# Patient Record
Sex: Male | Born: 1975 | Race: White | Hispanic: No | Marital: Married | Smoking: Never smoker
Health system: Southern US, Community
[De-identification: ages and names within clinical notes are randomized; demographics above are authoritative.]

## PROBLEM LIST (undated history)

## (undated) DIAGNOSIS — G4730 Sleep apnea, unspecified: Secondary | ICD-10-CM

## (undated) DIAGNOSIS — I10 Essential (primary) hypertension: Secondary | ICD-10-CM

## (undated) DIAGNOSIS — E119 Type 2 diabetes mellitus without complications: Secondary | ICD-10-CM

## (undated) HISTORY — DX: Sleep apnea, unspecified: G47.30

## (undated) HISTORY — PX: CYSTECTOMY: SUR359

## (undated) HISTORY — DX: Type 2 diabetes mellitus without complications: E11.9

## (undated) HISTORY — DX: Essential (primary) hypertension: I10

---

## 2003-05-31 HISTORY — PX: NASAL SEPTUM SURGERY: SHX37

## 2010-05-30 HISTORY — PX: OTHER SURGICAL HISTORY: SHX169

## 2011-02-14 ENCOUNTER — Ambulatory Visit: Payer: Self-pay | Admitting: Cardiothoracic Surgery

## 2011-02-17 ENCOUNTER — Ambulatory Visit: Payer: Self-pay | Admitting: Cardiothoracic Surgery

## 2011-02-28 ENCOUNTER — Ambulatory Visit: Payer: Self-pay | Admitting: Cardiothoracic Surgery

## 2011-03-31 ENCOUNTER — Ambulatory Visit: Payer: Self-pay | Admitting: Cardiothoracic Surgery

## 2012-03-25 IMAGING — CT CT CHEST W/ CM
1 series · 15 of 33 positions shown, 19 images · non-contrast
Comparison: none

REASON FOR EXAM: Abn Chest Xray  Diabetic Metformin
COMMENTS:

[Series 2: chest w/ 5.0 i41f 3 · axial · 0.94mm/px · z∈[-20,+256]mm · 15 of 65 slices shown, 19 images]
[im 5/65  mediastinal]
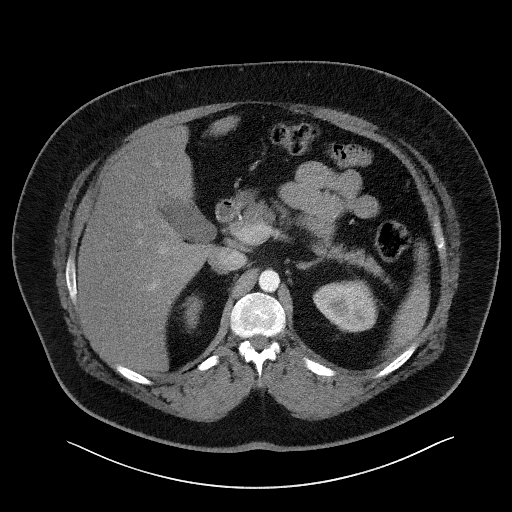
[im 5/65  lung]
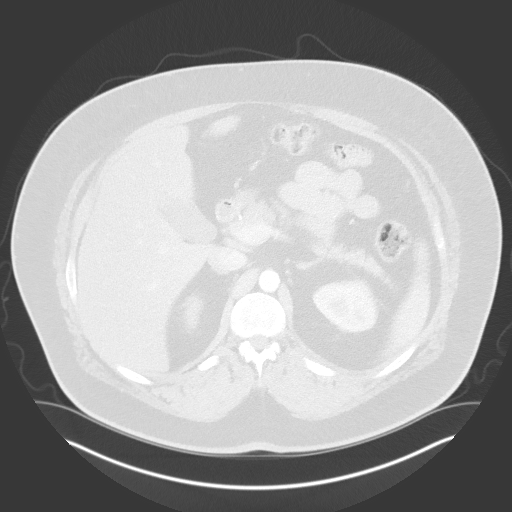
[im 10/65  lung]
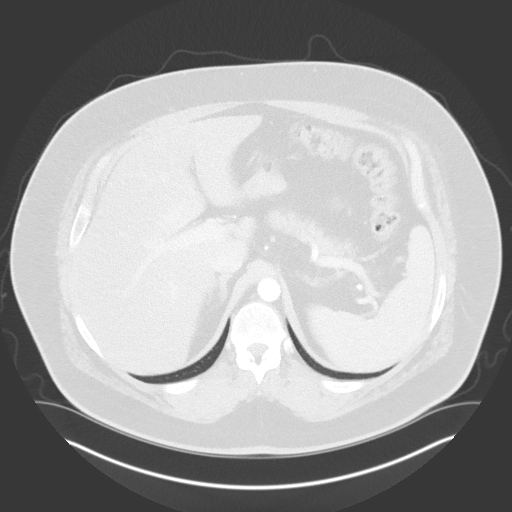
[im 13/65  lung]
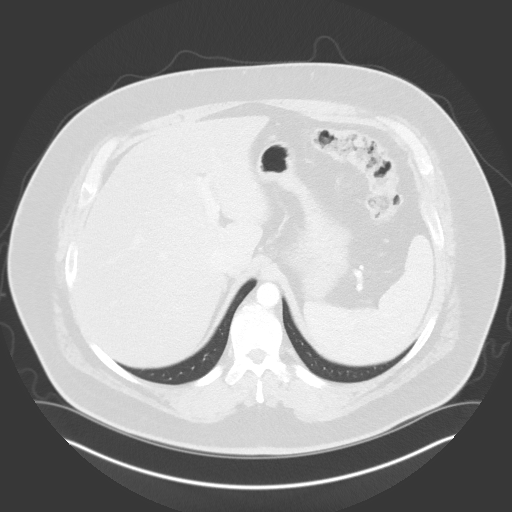
[im 17/65  lung]
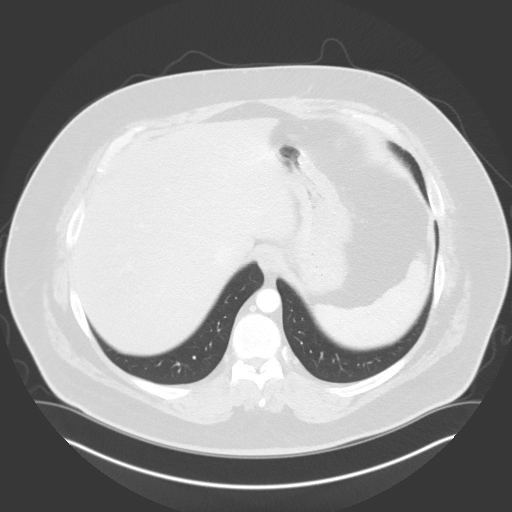
[im 22/65  mediastinal]
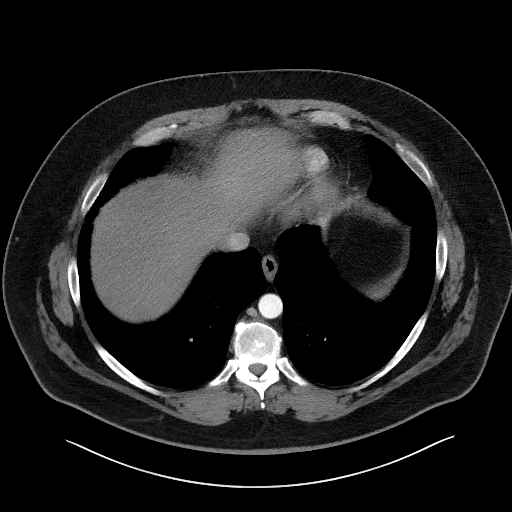
[im 22/65  lung]
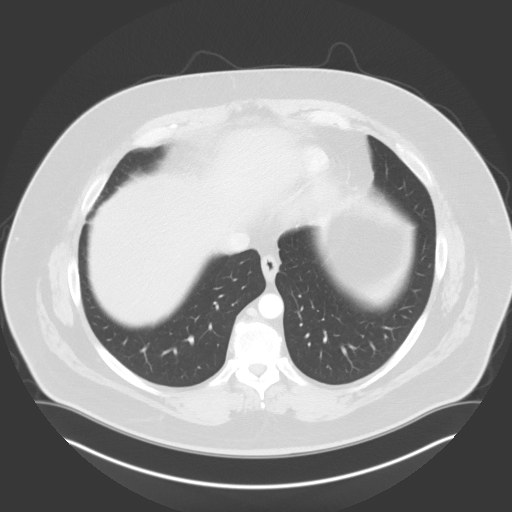
[im 26/65  lung]
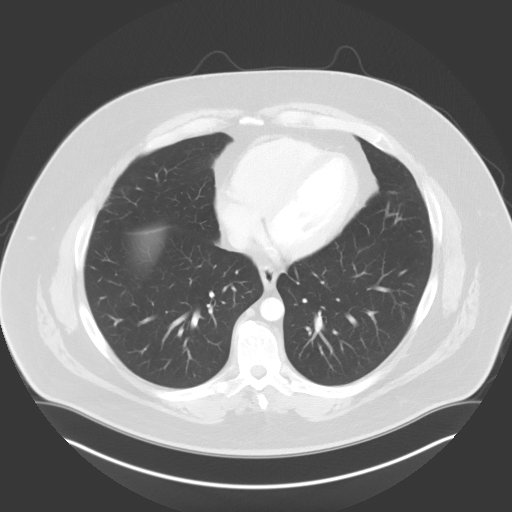
[im 29/65  lung]
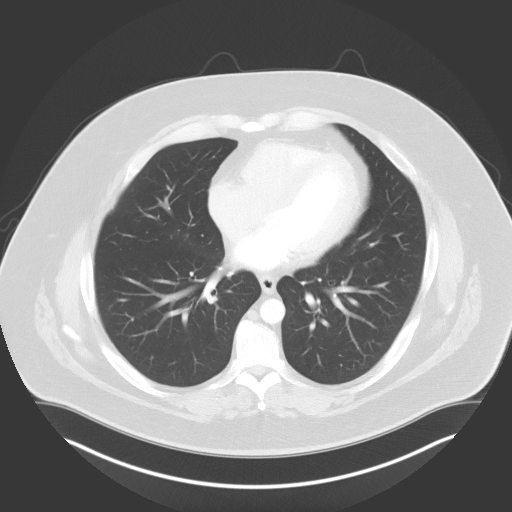
[im 34/65  lung]
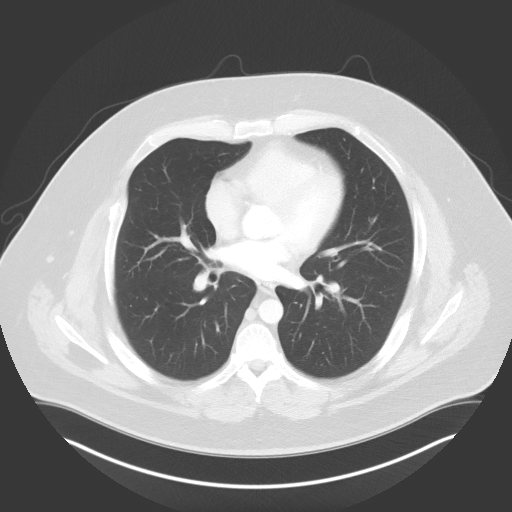
[im 36/65  mediastinal]
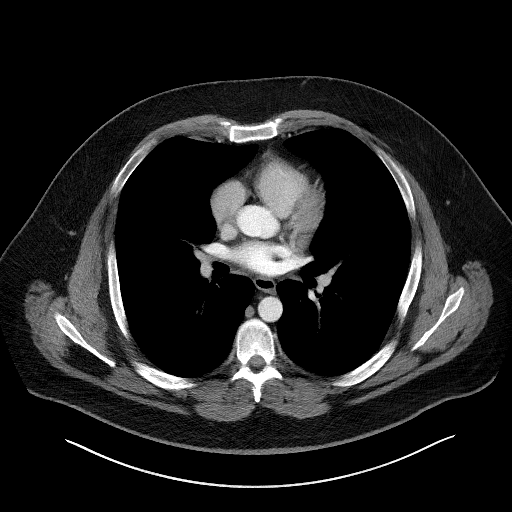
[im 36/65  lung]
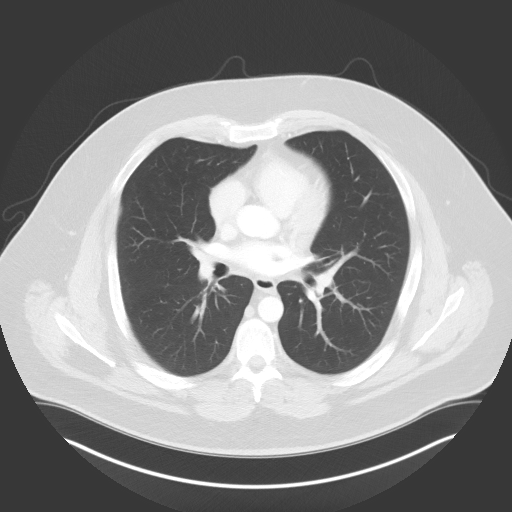
[im 39/65  lung]
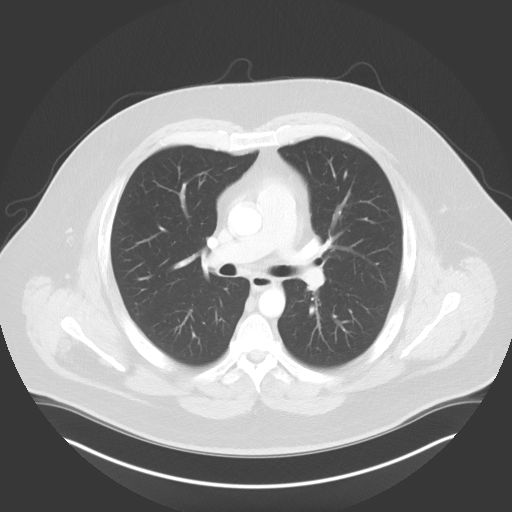
[im 43/65  lung]
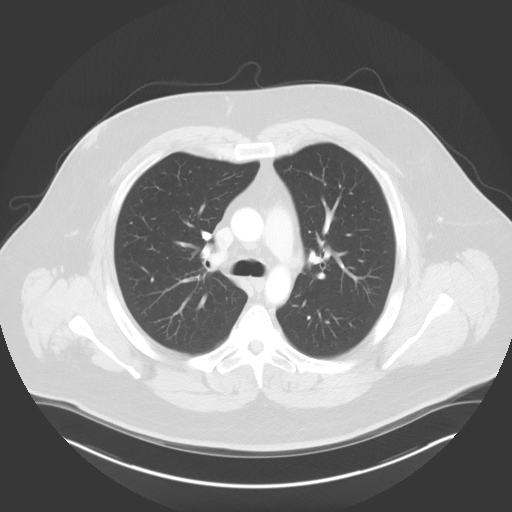
[im 48/65  lung]
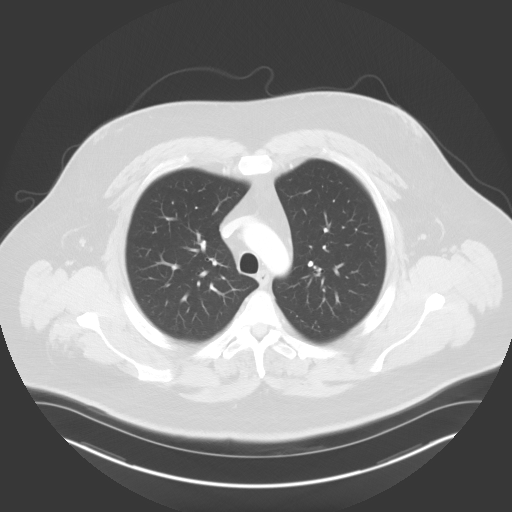
[im 52/65  mediastinal]
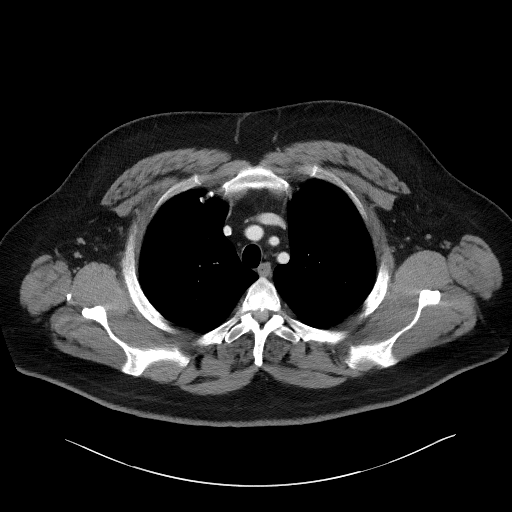
[im 52/65  lung]
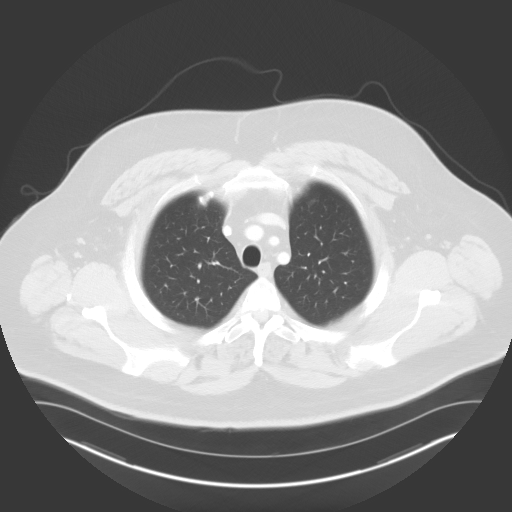
[im 55/65  lung]
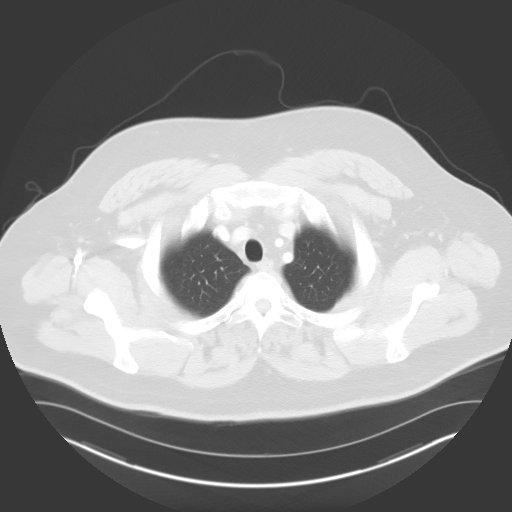
[im 60/65  lung]
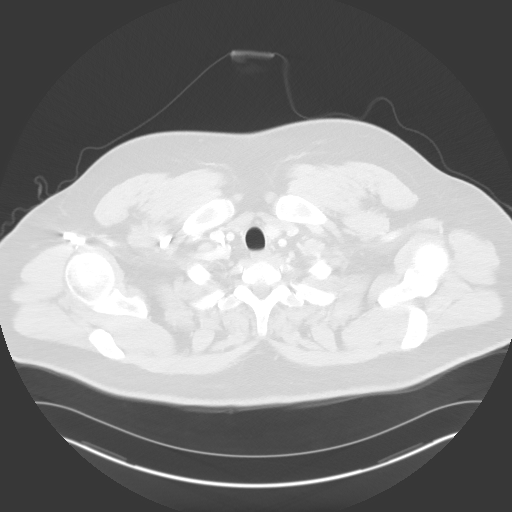

[15 of 33 positions shown; findings below may reference images not displayed]

PROCEDURE:     KCT - KCT CHEST WITH CONTRAST  - February 28, 2011  [DATE]

RESULT:     CT of the chest is performed utilizing 85 mL of 6sovue-92C
iodinated intravenous contrast. Images are reconstructed at 5.0 mm slice
thickness in the axial plane. There is no previous similar study for
comparison.

Lung window images demonstrate normal aeration without a focal mass. There
is deformity of the chest wall consistent with a previous rib fracture
involving the right sixth rib anterolaterally with evidence of callus
formation. No mediastinal or hilar mass or adenopathy is evident. The
thoracic aorta is normal in caliber. No significant pleural thickening is
demonstrated. The upper abdominal viscera included on the exam appear within
normal limits. The included portions of the thyroid lobes are unremarkable.
Thoracic spine vertebral body shows no evidence of compression deformity.
There is multilevel degenerative disc space narrowing and hypertrophic
degenerative spurring.
IMPRESSION: Old healed right rib fracture as discussed above. No
definite mass or adenopathy evident.

## 2013-03-11 ENCOUNTER — Encounter: Payer: Self-pay | Admitting: General Surgery

## 2013-03-11 ENCOUNTER — Ambulatory Visit (INDEPENDENT_AMBULATORY_CARE_PROVIDER_SITE_OTHER): Payer: BC Managed Care – PPO | Admitting: General Surgery

## 2013-03-11 VITALS — BP 128/70 | HR 76 | Resp 14 | Ht 75.0 in | Wt 331.0 lb

## 2013-03-11 DIAGNOSIS — L729 Follicular cyst of the skin and subcutaneous tissue, unspecified: Secondary | ICD-10-CM | POA: Insufficient documentation

## 2013-03-11 DIAGNOSIS — L723 Sebaceous cyst: Secondary | ICD-10-CM

## 2013-03-11 NOTE — Progress Notes (Signed)
Patient ID: Colton Long, male   DOB: 1976-01-15, 37 y.o.   MRN: [id number]  Chief Complaint  Patient presents with  . Other    evaluation of sebaceous cyst on scalp    HPI Colton Long is a 37 y.o. male who presents for an evaluation of sebaceous cyst on scalp. The patient states this cyst has been present for approximately 2 years. He denies any drainage or pain in the area. He has had several cysts on his head in the past that have been removed. He states this area has gotten larger in the last 6 months. A little more uncomfortable especially with the use of a hat and combing his hair.   HPI  Past Medical History  Diagnosis Date  . Diabetes mellitus without complication   . Sleep apnea   . Hypertension     Past Surgical History  Procedure Laterality Date  . Nasal septum surgery  2005  . Cystectomy  9147,8295    head     Family History  Problem Relation Age of Onset  . Cancer Father     lung    Social History History  Substance Use Topics  . Smoking status: Never Smoker   . Smokeless tobacco: Not on file  . Alcohol Use: Yes    Allergies  Allergen Reactions  . Other Rash    Steroid Cream    Current Outpatient Prescriptions  Medication Sig Dispense Refill  . Cholecalciferol (VITAMIN D-3 PO) Take 1,000 Int'l Units by mouth daily.      Marland Kitchen losartan (COZAAR) 50 MG tablet Take 50 mg by mouth daily.      . pioglitazone (ACTOS) 30 MG tablet Take 30 mg by mouth daily.      . pravastatin (PRAVACHOL) 20 MG tablet Take 20 mg by mouth daily.      . sitaGLIPtin-metformin (JANUMET) 50-1000 MG per tablet Take 1 tablet by mouth 2 (two) times daily with a meal.       No current facility-administered medications for this visit.    Review of Systems Review of Systems  Constitutional: Negative.   Respiratory: Negative.   Cardiovascular: Negative.     Blood pressure 128/70, pulse 76, resp. rate 14, height 6\' 3"  (1.905 m), weight 331 lb (150.141 kg).  Physical  Exam Physical Exam  Constitutional: He is oriented to person, place, and time. He appears well-developed and well-nourished.  Eyes: Conjunctivae are normal. No scleral icterus.  Neck: No thyromegaly present.  Cardiovascular: Normal rate, regular rhythm and normal heart sounds.   No murmur heard. Pulmonary/Chest: Effort normal and breath sounds normal.  Lymphadenopathy:    He has no cervical adenopathy.  Neurological: He is alert and oriented to person, place, and time.  Skin: Skin is warm and dry.       Data Reviewed None  Assessment    Skin cyst of the scalp. Symptomatic.  Discussed excision here in the office. Patient agreeable.     Plan    Patient to return for excision of skin cyst of the scalp.        Kaylin Schellenberg G 03/11/2013, 3:19 PM

## 2013-03-11 NOTE — Patient Instructions (Signed)
Patient to return for an excision of scalp cyst.

## 2013-03-25 ENCOUNTER — Ambulatory Visit: Payer: BC Managed Care – PPO | Admitting: General Surgery

## 2013-04-02 ENCOUNTER — Encounter: Payer: Self-pay | Admitting: General Surgery

## 2013-04-02 ENCOUNTER — Ambulatory Visit (INDEPENDENT_AMBULATORY_CARE_PROVIDER_SITE_OTHER): Payer: BC Managed Care – PPO | Admitting: General Surgery

## 2013-04-02 VITALS — BP 130/80 | HR 78 | Resp 14 | Ht 75.0 in

## 2013-04-02 DIAGNOSIS — L723 Sebaceous cyst: Secondary | ICD-10-CM

## 2013-04-02 DIAGNOSIS — L729 Follicular cyst of the skin and subcutaneous tissue, unspecified: Secondary | ICD-10-CM

## 2013-04-02 NOTE — Progress Notes (Signed)
Patient ID: Colton Long, male   DOB: 1976-04-20, 37 y.o.   MRN: [id number]  Procedure: Excision of cyst over the scalp.  Anesthetic 8 mL of half percent Marcaine mixed with epi and 1% Xylocaine.  After prep with the chlor prep the area was draped out with sterile drapes the cyst being located over the top of the scalp in the front third of the vertical elliptical incision of the skin was made in the pearllike a cystic mass underlying the skin was identified measuring a little over a centimeter in size this was easily freed and removed along with the small portion of skin overlying this the opening was then closed with 3 stitches of 4-0 Prolene.  Bleeding was minimal and no immediate problems were encountered. Neosporin ointment applied and further wound care instructions were given. He can shower normal. The sutures to be removed in about one week

## 2013-04-03 LAB — PATHOLOGY

## 2013-04-09 ENCOUNTER — Ambulatory Visit (INDEPENDENT_AMBULATORY_CARE_PROVIDER_SITE_OTHER): Payer: BC Managed Care – PPO | Admitting: *Deleted

## 2013-04-09 DIAGNOSIS — L723 Sebaceous cyst: Secondary | ICD-10-CM

## 2013-04-09 DIAGNOSIS — L729 Follicular cyst of the skin and subcutaneous tissue, unspecified: Secondary | ICD-10-CM

## 2013-04-09 NOTE — Progress Notes (Signed)
Patient came in today for a wound check.  The wound is clean, with no signs of infection noted. 3 sutures removed. Aware of pathology. Follow up as scheduled.

## 2013-04-09 NOTE — Patient Instructions (Signed)
The patient is aware to call back for any questions or concerns.  

## 2013-08-14 ENCOUNTER — Ambulatory Visit: Payer: Self-pay | Admitting: Family Medicine

## 2013-08-28 ENCOUNTER — Ambulatory Visit: Payer: Self-pay | Admitting: Family Medicine

## 2013-09-27 ENCOUNTER — Ambulatory Visit: Payer: Self-pay | Admitting: Family Medicine

## 2015-01-08 ENCOUNTER — Telehealth: Payer: Self-pay | Admitting: *Deleted

## 2015-01-08 NOTE — Telephone Encounter (Signed)
Received PA request for Janumet 50-1000, PA sent through cover my meds Key: [id number] Allow 24-72 hrs for decision. Pt has tried and failed Metformin 1000, Januvia 100 and Acots 30 mg.

## 2015-01-13 NOTE — Telephone Encounter (Signed)
Janumet has been approved x12 mos. Approval [id number]. Patient and Pharmacy notified.

## 2015-01-20 ENCOUNTER — Telehealth: Payer: Self-pay | Admitting: Family Medicine

## 2015-01-20 DIAGNOSIS — E785 Hyperlipidemia, unspecified: Secondary | ICD-10-CM

## 2015-01-20 DIAGNOSIS — I10 Essential (primary) hypertension: Secondary | ICD-10-CM

## 2015-01-20 DIAGNOSIS — E119 Type 2 diabetes mellitus without complications: Secondary | ICD-10-CM

## 2015-01-20 NOTE — Telephone Encounter (Signed)
Pt wife called requesting that we mail a  New Lab order to   Pt address.

## 2015-01-20 NOTE — Telephone Encounter (Signed)
Pt last seen 10/08/14 lipid, bmet, a1c and tsh. Pt never had labs drawn. I will re-enter labs. I called patient and let him know he can go to [facility] and have labs drawn, nothing further needed.

## 2015-01-23 ENCOUNTER — Ambulatory Visit: Payer: Self-pay | Admitting: Family Medicine

## 2015-03-23 ENCOUNTER — Telehealth: Payer: Self-pay | Admitting: Family Medicine

## 2015-03-23 ENCOUNTER — Other Ambulatory Visit: Payer: Self-pay

## 2015-03-23 DIAGNOSIS — I10 Essential (primary) hypertension: Secondary | ICD-10-CM

## 2015-03-23 DIAGNOSIS — E119 Type 2 diabetes mellitus without complications: Secondary | ICD-10-CM

## 2015-03-23 NOTE — Telephone Encounter (Signed)
Pt states he need a new lab order pt will be going  To  [facility]  On  [address]  [phone]

## 2015-03-23 NOTE — Telephone Encounter (Signed)
Ordered TSH Lipid BMP and A1C and left order up front.[facility]

## 2015-03-24 LAB — LIPID PANEL
CHOL/HDL RATIO: 3.9 ratio (ref 0.0–5.0)
Cholesterol, Total: 175 mg/dL (ref 100–199)
HDL: 45 mg/dL (ref >39)
LDL CALC: 95 mg/dL (ref 0–99)
Triglycerides: 177 mg/dL — ABNORMAL HIGH (ref 0–149)
VLDL CHOLESTEROL CAL: 35 mg/dL (ref 5–40)

## 2015-03-24 LAB — TSH: TSH: 1.75 u[IU]/mL (ref 0.450–4.500)

## 2015-03-24 LAB — BASIC METABOLIC PANEL
BUN/Creatinine Ratio: 11 (ref 9–20)
BUN: 9 mg/dL (ref 6–24)
CALCIUM: 9.5 mg/dL (ref 8.7–10.2)
CO2: 26 mmol/L (ref 18–29)
CREATININE: 0.79 mg/dL (ref 0.76–1.27)
Chloride: 103 mmol/L (ref 97–106)
GFR, EST AFRICAN AMERICAN: 129 mL/min/{1.73_m2} (ref >59)
GFR, EST NON AFRICAN AMERICAN: 112 mL/min/{1.73_m2} (ref >59)
Glucose: 118 mg/dL — ABNORMAL HIGH (ref 65–99)
POTASSIUM: 4.9 mmol/L (ref 3.5–5.2)
Sodium: 144 mmol/L (ref 136–144)

## 2015-03-24 LAB — HEMOGLOBIN A1C
Est. average glucose Bld gHb Est-mCnc: 160 mg/dL
HEMOGLOBIN A1C: 7.2 % — AB (ref 4.8–5.6)

## 2015-09-04 ENCOUNTER — Other Ambulatory Visit: Payer: Self-pay | Admitting: Family Medicine

## 2015-09-04 NOTE — Telephone Encounter (Signed)
Last office visit 10/08/2014. Patient requesting refills.

## 2015-09-07 MED ORDER — LOSARTAN POTASSIUM 50 MG PO TABS: 50.0000 mg | ORAL_TABLET | Freq: Every day | ORAL | Status: DC

## 2015-09-07 MED ORDER — PRAVASTATIN SODIUM 20 MG PO TABS: 20.0000 mg | ORAL_TABLET | Freq: Every day | ORAL | Status: DC

## 2015-09-07 MED ORDER — SITAGLIPTIN PHOS-METFORMIN HCL 50-1000 MG PO TABS: 1.0000 | ORAL_TABLET | Freq: Two times a day (BID) | ORAL | Status: DC

## 2015-09-10 ENCOUNTER — Other Ambulatory Visit: Payer: Self-pay | Admitting: Family Medicine

## 2015-09-10 MED ORDER — PIOGLITAZONE HCL 30 MG PO TABS: 30.0000 mg | ORAL_TABLET | Freq: Every day | ORAL | Status: DC

## 2015-09-22 ENCOUNTER — Encounter: Payer: Self-pay | Admitting: Family Medicine

## 2015-09-22 ENCOUNTER — Ambulatory Visit (INDEPENDENT_AMBULATORY_CARE_PROVIDER_SITE_OTHER): Payer: PRIVATE HEALTH INSURANCE | Admitting: Family Medicine

## 2015-09-22 VITALS — BP 131/92 | HR 85 | Temp 98.0°F | Resp 16 | Ht 72.4 in | Wt 337.8 lb

## 2015-09-22 DIAGNOSIS — Z0000 Encounter for general adult medical examination without abnormal findings: Secondary | ICD-10-CM

## 2015-09-22 DIAGNOSIS — M25512 Pain in left shoulder: Secondary | ICD-10-CM

## 2015-09-22 DIAGNOSIS — I10 Essential (primary) hypertension: Secondary | ICD-10-CM

## 2015-09-22 DIAGNOSIS — E669 Obesity, unspecified: Secondary | ICD-10-CM | POA: Diagnosis not present

## 2015-09-22 DIAGNOSIS — E785 Hyperlipidemia, unspecified: Secondary | ICD-10-CM | POA: Insufficient documentation

## 2015-09-22 DIAGNOSIS — E119 Type 2 diabetes mellitus without complications: Secondary | ICD-10-CM | POA: Diagnosis not present

## 2015-09-22 DIAGNOSIS — E559 Vitamin D deficiency, unspecified: Secondary | ICD-10-CM | POA: Insufficient documentation

## 2015-09-22 DIAGNOSIS — R000 Tachycardia, unspecified: Secondary | ICD-10-CM | POA: Insufficient documentation

## 2015-09-22 DIAGNOSIS — E1169 Type 2 diabetes mellitus with other specified complication: Secondary | ICD-10-CM | POA: Insufficient documentation

## 2015-09-22 DIAGNOSIS — Z6841 Body Mass Index (BMI) 40.0 and over, adult: Secondary | ICD-10-CM

## 2015-09-22 LAB — POCT GLYCOSYLATED HEMOGLOBIN (HGB A1C): Hemoglobin A1C: 7.2

## 2015-09-22 MED ORDER — MELOXICAM 15 MG PO TABS: 15.0000 mg | ORAL_TABLET | Freq: Every day | ORAL | Status: DC

## 2015-09-22 MED ORDER — PIOGLITAZONE HCL 45 MG PO TABS: 45.0000 mg | ORAL_TABLET | Freq: Every day | ORAL | Status: DC

## 2015-09-22 MED ORDER — SITAGLIPTIN PHOS-METFORMIN HCL 50-1000 MG PO TABS: 1.0000 | ORAL_TABLET | Freq: Two times a day (BID) | ORAL | Status: DC

## 2015-09-22 NOTE — Progress Notes (Signed)
Name: Colton Long   MRN: [id number]    DOB: Sep 10, 1975   Date:09/22/2015       Progress Note  Subjective  Chief Complaint  Chief Complaint  Patient presents with  . Annual Exam    HPI Here for complete physical.  He has gained weight and his A1c has gone up.  He c/o some L shoulder pain.  No trauma.  ROM is decreased.  Had a "kidney infection" 4 months ago.  Treated at urgent care and resolved.  No problem-specific assessment & plan notes found for this encounter.   Past Medical History  Diagnosis Date  . Diabetes mellitus without complication ([place])   . Sleep apnea   . Hypertension     Past Surgical History  Procedure Laterality Date  . Nasal septum surgery  2005  . Cystectomy  8099,8338    head   . Skin lesion exicision  2012    scalp Dr.[facility]    Family History  Problem Relation Age of Onset  . Cancer Father     lung  . Heart disease Father   . Hypertension Father   . Diabetes Father   . Hypertension Mother   . Diabetes Mother     Social History   Social History  . Marital Status: Married    Spouse Name: N/A  . Number of Children: N/A  . Years of Education: N/A   Occupational History  . Not on file.   Social History Main Topics  . Smoking status: Never Smoker   . Smokeless tobacco: Never Used  . Alcohol Use: 0.0 oz/week    0 Standard drinks or equivalent per week     Comment: occasional  . Drug Use: No  . Sexual Activity: Not on file   Other Topics Concern  . Not on file   Social History Narrative     Current outpatient prescriptions:  .  Blood Glucose Monitoring Suppl (ONE TOUCH ULTRA SYSTEM KIT) w/Device KIT, 1 kit by Does not apply route once., Disp: , Rfl:  .  Cholecalciferol (VITAMIN D-3 PO), Take 1,000 Int'l Units by mouth daily., Disp: , Rfl:  .  glucose blood test strip, 1 each by Other route as needed for other. Use as instructed, Disp: , Rfl:  .  losartan (COZAAR) 50 MG tablet, Take 1 tablet (50 mg total) by mouth daily.,  Disp: 90 tablet, Rfl: 3 .  pioglitazone (ACTOS) 45 MG tablet, Take 1 tablet (45 mg total) by mouth daily., Disp: 90 tablet, Rfl: 3 .  pravastatin (PRAVACHOL) 20 MG tablet, Take 1 tablet (20 mg total) by mouth daily., Disp: 90 tablet, Rfl: 3 .  sitaGLIPtin-metformin (JANUMET) 50-1000 MG tablet, Take 1 tablet by mouth 2 (two) times daily with a meal., Disp: 180 tablet, Rfl: 3 .  meloxicam (MOBIC) 15 MG tablet, Take 1 tablet (15 mg total) by mouth daily., Disp: 30 tablet, Rfl: 6  Allergies  Allergen Reactions  . Other Rash    Steroid Cream     Review of Systems  Constitutional: Negative for fever, chills, weight loss and malaise/fatigue.  HENT: Negative for hearing loss.   Eyes: Negative for blurred vision and double vision.  Respiratory: Negative for cough, shortness of breath and wheezing.   Cardiovascular: Negative for chest pain, palpitations and leg swelling.  Gastrointestinal: Negative for heartburn, abdominal pain and blood in stool.  Genitourinary: Negative for dysuria, urgency and frequency.  Musculoskeletal: Positive for joint pain. Negative for myalgias.  Skin: Negative for  itching and rash.  Neurological: Negative for dizziness, tremors, weakness and headaches.  Psychiatric/Behavioral: Negative for depression. The patient is not nervous/anxious.       Objective  Filed Vitals:   09/22/15 1402  BP: 131/92  Pulse: 85  Temp: 98 F (36.7 C)  TempSrc: Oral  Resp: 16  Height: 6' 0.4" (1.839 m)  Weight: 337 lb 12.8 oz (153.225 kg)    Physical Exam  Constitutional: He is oriented to person, place, and time and well-developed, well-nourished, and in no distress. No distress.  HENT:  Head: Normocephalic and atraumatic.  Right Ear: External ear normal.  Left Ear: External ear normal.  Nose: Nose normal.  Mouth/Throat: Oropharynx is clear and moist.  Eyes: Conjunctivae and EOM are normal. Pupils are equal, round, and reactive to light. No scleral icterus.  Neck: Normal  range of motion. Neck supple. Carotid bruit is not present. No thyromegaly present.  Cardiovascular: Normal rate, regular rhythm and normal heart sounds.  Exam reveals no gallop and no friction rub.   No murmur heard. Pulmonary/Chest: Effort normal and breath sounds normal. No respiratory distress. He has no wheezes. He has no rales.  Abdominal: Soft. Bowel sounds are normal. He exhibits no distension, no abdominal bruit and no mass. There is no tenderness.  Musculoskeletal: He exhibits no edema.  Lymphadenopathy:    He has no cervical adenopathy.  Neurological: He is alert and oriented to person, place, and time.  Skin: Skin is warm and dry. No rash noted. No erythema. No pallor.  Psychiatric: Mood, memory, affect and judgment normal.  Vitals reviewed.      No results found for this or any previous visit (from the past 2160 hour(s)).   Assessment & Plan  Problem List Items Addressed This Visit      Cardiovascular and Mediastinum   Essential hypertension     Endocrine   Diabetes ([place]) - Primary   Relevant Medications   pioglitazone (ACTOS) 45 MG tablet   sitaGLIPtin-metformin (JANUMET) 50-1000 MG tablet   Other Relevant Orders   POCT HgB A1C     Other   Obesity   Relevant Medications   pioglitazone (ACTOS) 45 MG tablet   sitaGLIPtin-metformin (JANUMET) 50-1000 MG tablet   Health maintenance examination    Other Visit Diagnoses    Shoulder pain, left        Relevant Medications    meloxicam (MOBIC) 15 MG tablet    Other Relevant Orders    Ambulatory referral to Physical Therapy       Meds ordered this encounter  Medications  . pioglitazone (ACTOS) 45 MG tablet    Sig: Take 1 tablet (45 mg total) by mouth daily.    Dispense:  90 tablet    Refill:  3  . sitaGLIPtin-metformin (JANUMET) 50-1000 MG tablet    Sig: Take 1 tablet by mouth 2 (two) times daily with a meal.    Dispense:  180 tablet    Refill:  3  . meloxicam (MOBIC) 15 MG tablet    Sig: Take 1 tablet  (15 mg total) by mouth daily.    Dispense:  30 tablet    Refill:  6   1. Type 2 diabetes mellitus without complication, without long-term current use of insulin (H[facility])  - POCT HgB A1C-7.2 - pioglitazone (ACTOS) 45 MG tablet; Take 1 tablet (45 mg total) by mouth daily.  Dispense: 90 tablet; Refill: 3 - sitaGLIPtin-metformin (JANUMET) 50-1000 MG tablet; Take 1 tablet by mouth 2 (two) times  daily with a meal.  Dispense: 180 tablet; Refill: 3 Cont Pravastatin 2. Health maintenance examination   3. Essential hypertension Cont. Losartan  4. Obesity Discussed weigh loss y reducing calories and exercise.  5. Shoulder pain, left  - meloxicam (MOBIC) 15 MG tablet; Take 1 tablet (15 mg total) by mouth daily.  Dispense: 30 tablet; Refill: 6 - Ambulatory referral to Physical Therapy

## 2015-09-22 NOTE — Patient Instructions (Signed)
Check about Sleep apnea info from [employer].  ? Need new sleep study.

## 2015-12-24 ENCOUNTER — Ambulatory Visit: Payer: PRIVATE HEALTH INSURANCE | Admitting: Family Medicine

## 2016-01-05 ENCOUNTER — Encounter: Payer: Self-pay | Admitting: Family Medicine

## 2016-01-05 ENCOUNTER — Ambulatory Visit (INDEPENDENT_AMBULATORY_CARE_PROVIDER_SITE_OTHER): Payer: PRIVATE HEALTH INSURANCE | Admitting: Family Medicine

## 2016-01-05 VITALS — BP 130/80 | HR 83 | Temp 98.1°F | Resp 16 | Ht 72.4 in | Wt 349.0 lb

## 2016-01-05 DIAGNOSIS — I10 Essential (primary) hypertension: Secondary | ICD-10-CM

## 2016-01-05 DIAGNOSIS — E119 Type 2 diabetes mellitus without complications: Secondary | ICD-10-CM | POA: Diagnosis not present

## 2016-01-05 DIAGNOSIS — E0800 Diabetes mellitus due to underlying condition with hyperosmolarity without nonketotic hyperglycemic-hyperosmolar coma (NKHHC): Secondary | ICD-10-CM | POA: Diagnosis not present

## 2016-01-05 DIAGNOSIS — F419 Anxiety disorder, unspecified: Secondary | ICD-10-CM | POA: Insufficient documentation

## 2016-01-05 DIAGNOSIS — R451 Restlessness and agitation: Secondary | ICD-10-CM | POA: Diagnosis not present

## 2016-01-05 DIAGNOSIS — E669 Obesity, unspecified: Secondary | ICD-10-CM | POA: Diagnosis not present

## 2016-01-05 LAB — POCT GLYCOSYLATED HEMOGLOBIN (HGB A1C)

## 2016-01-05 MED ORDER — FLUOXETINE HCL 10 MG PO CAPS: 10.0000 mg | ORAL_CAPSULE | Freq: Every day | ORAL | 6 refills | Status: DC

## 2016-01-05 NOTE — Progress Notes (Signed)
Name: Colton Long   MRN: [id number]    DOB: [birth year]   Date:01/05/2016       Progress Note  Subjective  Chief Complaint  Chief Complaint  Patient presents with  . Diabetes    HPI Here for f/u of DM and obesity.   He is gaining weight.  He asks  About his increased agitation over past 6 months or so.  Increased stress. No problem-specific Assessment & Plan notes found for this encounter.   Past Medical History:  Diagnosis Date  . Diabetes mellitus without complication ([place])   . Hypertension   . Sleep apnea     Past Surgical History:  Procedure Laterality Date  . CYSTECTOMY  4098,1191   head   . NASAL SEPTUM SURGERY  2005  . skin lesion exicision  2012   scalp Dr[facility]    Family History  Problem Relation Age of Onset  . Cancer Father     lung  . Heart disease Father   . Hypertension Father   . Diabetes Father   . Hypertension Mother   . Diabetes Mother     Social History   Social History  . Marital status: Married    Spouse name: N/A  . Number of children: N/A  . Years of education: N/A   Occupational History  . Not on file.   Social History Main Topics  . Smoking status: Never Smoker  . Smokeless tobacco: Never Used  . Alcohol use 0.0 oz/week     Comment: occasional  . Drug use: No  . Sexual activity: Not on file   Other Topics Concern  . Not on file   Social History Narrative  . No narrative on file     Current Outpatient Prescriptions:  .  Blood Glucose Monitoring Suppl (ONE TOUCH ULTRA SYSTEM KIT) w/Device KIT, 1 kit by Does not apply route once., Disp: , Rfl:  .  Cholecalciferol (VITAMIN D-3 PO), Take 1,000 Int'l Units by mouth daily., Disp: , Rfl:  .  glucose blood test strip, 1 each by Other route as needed for other. Use as instructed, Disp: , Rfl:  .  losartan (COZAAR) 50 MG tablet, Take 1 tablet (50 mg total) by mouth daily., Disp: 90 tablet, Rfl: 3 .  meloxicam (MOBIC) 15 MG tablet, Take 1 tablet (15 mg total) by mouth  daily., Disp: 30 tablet, Rfl: 6 .  pioglitazone (ACTOS) 45 MG tablet, Take 1 tablet (45 mg total) by mouth daily., Disp: 90 tablet, Rfl: 3 .  pravastatin (PRAVACHOL) 20 MG tablet, Take 1 tablet (20 mg total) by mouth daily., Disp: 90 tablet, Rfl: 3 .  sitaGLIPtin-metformin (JANUMET) 50-1000 MG tablet, Take 1 tablet by mouth 2 (two) times daily with a meal., Disp: 180 tablet, Rfl: 3 .  FLUoxetine (PROZAC) 10 MG capsule, Take 1 capsule (10 mg total) by mouth daily., Disp: 30 capsule, Rfl: 6  Allergies  Allergen Reactions  . Other Rash    Steroid Cream     Review of Systems  Constitutional: Negative for chills, fever and weight loss.  HENT: Negative for hearing loss.   Eyes: Negative for blurred vision and double vision.  Respiratory: Negative for hemoptysis, shortness of breath and wheezing.   Cardiovascular: Negative for chest pain, palpitations and leg swelling.  Gastrointestinal: Negative for abdominal pain, blood in stool and heartburn.  Genitourinary: Negative for dysuria, frequency and urgency.  Skin: Negative for rash.  Neurological: Negative for dizziness, tremors, weakness and headaches.  Psychiatric/Behavioral:  Negative for depression. The patient is nervous/anxious.       Objective  Vitals:   01/05/16 1546 01/05/16 1618  BP: (!) 134/92 130/80  Pulse: 83   Resp: 16   Temp: 98.1 F (36.7 C)   TempSrc: Oral   Weight: (!) 349 lb (158.3 kg)   Height: 6' 0.4" (1.839 m)     Physical Exam  Constitutional: He is oriented to person, place, and time and well-developed, well-nourished, and in no distress. No distress.  HENT:  Head: Normocephalic and atraumatic.  Eyes: Conjunctivae are normal. Pupils are equal, round, and reactive to light. No scleral icterus.  Neck: Normal range of motion. Neck supple. No thyromegaly present.  Cardiovascular: Normal rate, regular rhythm and normal heart sounds.  Exam reveals no gallop and no friction rub.   No murmur  heard. Pulmonary/Chest: Effort normal and breath sounds normal. No respiratory distress. He has no wheezes. He has no rales.  Musculoskeletal: He exhibits edema (trace bilteral pedal edema.).  Lymphadenopathy:    He has no cervical adenopathy.  Neurological: He is alert and oriented to person, place, and time.  Psychiatric: Mood, memory, affect and judgment normal.  Vitals reviewed.      Recent Results (from the past 2160 hour(s))  POCT HgB A1C     Status: Abnormal   Collection Time: 01/05/16  4:02 PM  Result Value Ref Range   Hemoglobin A1C 6.8%      Assessment & Plan  Problem List Items Addressed This Visit      Cardiovascular and Mediastinum   Essential hypertension     Endocrine   Diabetes ([place])     Other   Obesity   Agitation   Relevant Medications   FLUoxetine (PROZAC) 10 MG capsule    Other Visit Diagnoses    Diabetes mellitus without complication ([place])    -  Primary   Relevant Orders   POCT HgB A1C (Completed)      Meds ordered this encounter  Medications  . FLUoxetine (PROZAC) 10 MG capsule    Sig: Take 1 capsule (10 mg total) by mouth daily.    Dispense:  30 capsule    Refill:  6  1. Diabetes mellitus without complication ([place])  - POCT HgB A1C-6.8 Cont meds 2. Agitation  - FLUoxetine (PROZAC) 10 MG capsule; Take 1 capsule (10 mg total) by mouth daily.  Dispense: 30 capsule; Refill: 6  3. Diabetes mellitus due to underlying condition with hyperosmolarity without coma, without long-term current use of insulin ([place])   4. Essential hypertension Cont meds  5. Obesity Discussed weight loss and diet.

## 2016-01-28 ENCOUNTER — Telehealth: Payer: Self-pay | Admitting: *Deleted

## 2016-01-28 NOTE — Telephone Encounter (Signed)
Jamumet 50-1000 has been approved by [employer] [facility]. It is approved thru 01/24/2017.  Pt Id: [id number] GPI: [id number]

## 2016-02-16 ENCOUNTER — Ambulatory Visit: Payer: PRIVATE HEALTH INSURANCE | Admitting: Family Medicine

## 2016-02-29 ENCOUNTER — Encounter: Payer: Self-pay | Admitting: Family Medicine

## 2016-02-29 ENCOUNTER — Ambulatory Visit (INDEPENDENT_AMBULATORY_CARE_PROVIDER_SITE_OTHER): Payer: PRIVATE HEALTH INSURANCE | Admitting: Family Medicine

## 2016-02-29 VITALS — BP 119/75 | HR 87 | Temp 98.3°F | Resp 16 | Ht 72.4 in | Wt 352.0 lb

## 2016-02-29 DIAGNOSIS — I10 Essential (primary) hypertension: Secondary | ICD-10-CM | POA: Diagnosis not present

## 2016-02-29 DIAGNOSIS — R451 Restlessness and agitation: Secondary | ICD-10-CM | POA: Diagnosis not present

## 2016-02-29 DIAGNOSIS — E0800 Diabetes mellitus due to underlying condition with hyperosmolarity without nonketotic hyperglycemic-hyperosmolar coma (NKHHC): Secondary | ICD-10-CM | POA: Diagnosis not present

## 2016-02-29 MED ORDER — FLUOXETINE HCL 20 MG PO CAPS: 20.0000 mg | ORAL_CAPSULE | Freq: Every day | ORAL | 6 refills | Status: DC

## 2016-02-29 NOTE — Patient Instructions (Signed)
Patient has already had flu vaccine.

## 2016-02-29 NOTE — Progress Notes (Signed)
Name: Colton Long   MRN: [id number]    DOB: 05/08/1976   Date:02/29/2016       Progress Note  Subjective  Chief Complaint  Chief Complaint  Patient presents with  . Follow-up    agitation    HPI Here for f/u of agitation.  He feels much better on the 10 mg Prozac, but feels that a little more would be helpful.  He is happy with the improvement.   Hew has not lost any weight.  He is not checking his sugars regularly at this time.  He cont to take all of his meds.  No problem-specific Assessment & Plan notes found for this encounter.   Past Medical History:  Diagnosis Date  . Diabetes mellitus without complication ([place])   . Hypertension   . Sleep apnea     Past Surgical History:  Procedure Laterality Date  . CYSTECTOMY  8144,8185   head   . NASAL SEPTUM SURGERY  2005  . skin lesion exicision  2012   scalp Dr.[facility]    Family History  Problem Relation Age of Onset  . Cancer Father     lung  . Heart disease Father   . Hypertension Father   . Diabetes Father   . Hypertension Mother   . Diabetes Mother     Social History   Social History  . Marital status: Married    Spouse name: N/A  . Number of children: N/A  . Years of education: N/A   Occupational History  . Not on file.   Social History Main Topics  . Smoking status: Never Smoker  . Smokeless tobacco: Never Used  . Alcohol use 0.0 oz/week     Comment: occasional  . Drug use: No  . Sexual activity: Not on file   Other Topics Concern  . Not on file   Social History Narrative  . No narrative on file     Current Outpatient Prescriptions:  .  Blood Glucose Monitoring Suppl (ONE TOUCH ULTRA SYSTEM KIT) w/Device KIT, 1 kit by Does not apply route once., Disp: , Rfl:  .  Cholecalciferol (VITAMIN D-3 PO), Take 1,000 Int'l Units by mouth daily., Disp: , Rfl:  .  FLUARIX QUADRIVALENT 0.5 ML injection, Inject 0.5 mLs into the muscle once., Disp: , Rfl: 0 .  FLUoxetine (PROZAC) 20 MG capsule,  Take 1 capsule (20 mg total) by mouth daily., Disp: 30 capsule, Rfl: 6 .  glucose blood test strip, 1 each by Other route as needed for other. Use as instructed, Disp: , Rfl:  .  losartan (COZAAR) 50 MG tablet, Take 1 tablet (50 mg total) by mouth daily., Disp: 90 tablet, Rfl: 3 .  meloxicam (MOBIC) 15 MG tablet, Take 1 tablet (15 mg total) by mouth daily., Disp: 30 tablet, Rfl: 6 .  pioglitazone (ACTOS) 45 MG tablet, Take 1 tablet (45 mg total) by mouth daily., Disp: 90 tablet, Rfl: 3 .  PNEUMOVAX 23 25 MCG/0.5ML injection, Inject 0.5 mLs into the muscle once., Disp: , Rfl: 0 .  pravastatin (PRAVACHOL) 20 MG tablet, Take 1 tablet (20 mg total) by mouth daily., Disp: 90 tablet, Rfl: 3 .  sitaGLIPtin-metformin (JANUMET) 50-1000 MG tablet, Take 1 tablet by mouth 2 (two) times daily with a meal., Disp: 180 tablet, Rfl: 3  Allergies  Allergen Reactions  . Other Rash    Steroid Cream     Review of Systems  Constitutional: Negative for chills, fever, malaise/fatigue and weight loss.  HENT: Negative for hearing loss.   Eyes: Negative for blurred vision and double vision.  Respiratory: Negative for cough, shortness of breath and wheezing.   Cardiovascular: Negative for chest pain, palpitations and leg swelling.  Gastrointestinal: Negative for abdominal pain, blood in stool and heartburn.  Genitourinary: Negative for dysuria, frequency and urgency.  Musculoskeletal: Negative for joint pain and myalgias.  Skin: Negative for rash.  Neurological: Negative for dizziness, tremors, weakness and headaches.  Psychiatric/Behavioral: Negative for depression. The patient is not nervous/anxious and does not have insomnia.       Objective  Vitals:   02/29/16 1549  BP: 119/75  Pulse: 87  Resp: 16  Temp: 98.3 F (36.8 C)  TempSrc: Oral  Weight: (!) 352 lb (159.7 kg)  Height: 6' 0.4" (1.839 m)    Physical Exam  Constitutional: He is oriented to person, place, and time and well-developed,  well-nourished, and in no distress. No distress.  HENT:  Head: Normocephalic and atraumatic.  Eyes: Conjunctivae and EOM are normal. Pupils are equal, round, and reactive to light.  Neck: Normal range of motion. Neck supple. No thyromegaly present.  Cardiovascular: Normal rate, regular rhythm and normal heart sounds.  Exam reveals no gallop and no friction rub.   No murmur heard. Pulmonary/Chest: Effort normal and breath sounds normal. No respiratory distress. He has no wheezes. He has no rales.  Musculoskeletal: He exhibits no edema.  Lymphadenopathy:    He has no cervical adenopathy.  Neurological: He is alert and oriented to person, place, and time.  Vitals reviewed.      Recent Results (from the past 2160 hour(s))  POCT HgB A1C     Status: Abnormal   Collection Time: 01/05/16  4:02 PM  Result Value Ref Range   Hemoglobin A1C 6.8%      Assessment & Plan  Problem List Items Addressed This Visit      Cardiovascular and Mediastinum   Essential hypertension     Endocrine   Diabetes ([place])     Other   Agitation - Primary   Relevant Medications   FLUoxetine (PROZAC) 20 MG capsule    Other Visit Diagnoses   None.     Meds ordered this encounter  Medications  . FLUARIX QUADRIVALENT 0.5 ML injection    Sig: Inject 0.5 mLs into the muscle once.    Refill:  0  . PNEUMOVAX 23 25 MCG/0.5ML injection    Sig: Inject 0.5 mLs into the muscle once.    Refill:  0  . FLUoxetine (PROZAC) 20 MG capsule    Sig: Take 1 capsule (20 mg total) by mouth daily.    Dispense:  30 capsule    Refill:  6   1. Agitation  - FLUoxetine (PROZAC) 20 MG capsule; Take 1 capsule (20 mg total) by mouth daily.  Dispense: 30 capsule; Refill: 6  2. Essential hypertension Cont meds  3. Diabetes mellitus due to underlying condition with hyperosmolarity without coma, without long-term current use of insulin (HCC)  Cont meds

## 2016-05-10 ENCOUNTER — Ambulatory Visit: Payer: PRIVATE HEALTH INSURANCE | Admitting: Family Medicine

## 2016-05-17 ENCOUNTER — Encounter: Payer: Self-pay | Admitting: Family Medicine

## 2016-05-17 ENCOUNTER — Ambulatory Visit (INDEPENDENT_AMBULATORY_CARE_PROVIDER_SITE_OTHER): Payer: PRIVATE HEALTH INSURANCE | Admitting: Family Medicine

## 2016-05-17 VITALS — BP 121/75 | HR 102 | Temp 98.2°F | Resp 16 | Ht 72.4 in | Wt 361.0 lb

## 2016-05-17 DIAGNOSIS — IMO0001 Reserved for inherently not codable concepts without codable children: Secondary | ICD-10-CM

## 2016-05-17 DIAGNOSIS — I10 Essential (primary) hypertension: Secondary | ICD-10-CM

## 2016-05-17 DIAGNOSIS — Z6841 Body Mass Index (BMI) 40.0 and over, adult: Secondary | ICD-10-CM

## 2016-05-17 DIAGNOSIS — E6609 Other obesity due to excess calories: Secondary | ICD-10-CM

## 2016-05-17 DIAGNOSIS — E119 Type 2 diabetes mellitus without complications: Secondary | ICD-10-CM | POA: Diagnosis not present

## 2016-05-17 DIAGNOSIS — E0800 Diabetes mellitus due to underlying condition with hyperosmolarity without nonketotic hyperglycemic-hyperosmolar coma (NKHHC): Secondary | ICD-10-CM

## 2016-05-17 LAB — POCT GLYCOSYLATED HEMOGLOBIN (HGB A1C): Hemoglobin A1C: 7.9

## 2016-05-17 MED ORDER — EMPAGLIFLOZIN 25 MG PO TABS: 25.0000 mg | ORAL_TABLET | Freq: Every day | ORAL | 6 refills | Status: DC

## 2016-05-17 NOTE — Progress Notes (Signed)
Name: Colton Long   MRN: [id number]    DOB: 03-07-1976   Date:05/17/2016       Progress Note  Subjective  Chief Complaint  Chief Complaint  Patient presents with  . Diabetes    last A1C 6.8%01/05/16    HPI  Here for f/u of DM and obesity.  He has gained about 10 # more in past 3 months. A1c is up to 7.9  He is feeling ok  Overall except for lowser energy level. No problem-specific Assessment & Plan notes found for this encounter.   Past Medical History:  Diagnosis Date  . Diabetes mellitus without complication ([place])   . Hypertension   . Sleep apnea     Past Surgical History:  Procedure Laterality Date  . CYSTECTOMY  [id number]   head   . NASAL SEPTUM SURGERY  2005  . skin lesion exicision  2012   scalp Dr.[facility]ison [facility]NT    Family History  Problem Relation Age of Onset  . Cancer Father     lung  . Heart disease Father   . Hypertension Father   . Diabetes Father   . Hypertension Mother   . Diabetes Mother     Social History   Social History  . Marital status: Married    Spouse name: N/A  . Number of children: N/A  . Years of education: N/A   Occupational History  . Not on file.   Social History Main Topics  . Smoking status: Never Smoker  . Smokeless tobacco: Never Used  . Alcohol use 0.0 oz/week     Comment: occasional  . Drug use: No  . Sexual activity: Not on file   Other Topics Concern  . Not on file   Social History Narrative  . No narrative on file     Current Outpatient Prescriptions:  .  Blood Glucose Monitoring Suppl (ONE TOUCH ULTRA SYSTEM KIT) w/Device KIT, 1 kit by Does not apply route once., Disp: , Rfl:  .  Cholecalciferol (VITAMIN D-3 PO), Take 1,000 Int'l Units by mouth daily., Disp: , Rfl:  .  FLUARIX QUADRIVALENT 0.5 ML injection, Inject 0.5 mLs into the muscle once., Disp: , Rfl: 0 .  FLUoxetine (PROZAC) 20 MG capsule, Take 1 capsule (20 mg total) by mouth daily., Disp: 30 capsule, Rfl: 6 .  glucose blood test strip, 1 each  by Other route as needed for other. Use as instructed, Disp: , Rfl:  .  losartan (COZAAR) 50 MG tablet, Take 1 tablet (50 mg total) by mouth daily., Disp: 90 tablet, Rfl: 3 .  meloxicam (MOBIC) 15 MG tablet, Take 1 tablet (15 mg total) by mouth daily., Disp: 30 tablet, Rfl: 6 .  pioglitazone (ACTOS) 45 MG tablet, Take 1 tablet (45 mg total) by mouth daily., Disp: 90 tablet, Rfl: 3 .  PNEUMOVAX 23 25 MCG/0.5ML injection, Inject 0.5 mLs into the muscle once., Disp: , Rfl: 0 .  pravastatin (PRAVACHOL) 20 MG tablet, Take 1 tablet (20 mg total) by mouth daily., Disp: 90 tablet, Rfl: 3 .  sitaGLIPtin-metformin (JANUMET) 50-1000 MG tablet, Take 1 tablet by mouth 2 (two) times daily with a meal., Disp: 180 tablet, Rfl: 3 .  empagliflozin (JARDIANCE) 25 MG TABS tablet, Take 25 mg by mouth daily., Disp: 30 tablet, Rfl: 6  Allergies  Allergen Reactions  . Other Rash    Steroid Cream     Review of Systems  Constitutional: Negative for chills, fever, malaise/fatigue and weight loss.  HENT: Negative  for hearing loss and tinnitus.   Eyes: Negative for blurred vision and double vision.  Respiratory: Negative for cough, shortness of breath and wheezing.   Cardiovascular: Negative for chest pain, palpitations and leg swelling.  Gastrointestinal: Negative for abdominal pain, blood in stool and heartburn.  Genitourinary: Negative for dysuria, frequency and urgency.  Musculoskeletal: Negative for back pain and myalgias.  Skin: Negative for rash.  Neurological: Negative for dizziness, tingling, tremors, weakness and headaches.      Objective  Vitals:   05/17/16 1518  BP: 121/75  Pulse: (!) 102  Resp: 16  Temp: 98.2 F (36.8 C)  TempSrc: Oral  Weight: (!) 361 lb (163.7 kg)  Height: 6' 0.4" (1.839 m)    Physical Exam  Constitutional: He is oriented to person, place, and time and well-developed, well-nourished, and in no distress. No distress.  HENT:  Head: Normocephalic and atraumatic.   Eyes: Conjunctivae and EOM are normal. Pupils are equal, round, and reactive to light. No scleral icterus.  Neck: Normal range of motion. Neck supple. Carotid bruit is not present. No thyromegaly present.  Cardiovascular: Regular rhythm and normal heart sounds.  Tachycardia present.  Exam reveals no gallop and no friction rub.   No murmur heard. Pulmonary/Chest: Effort normal and breath sounds normal. No respiratory distress. He has no wheezes. He has no rales.  Musculoskeletal: He exhibits edema (trace bilateral pedal edema).  Lymphadenopathy:    He has no cervical adenopathy.  Neurological: He is alert and oriented to person, place, and time.  Vitals reviewed.      Recent Results (from the past 2160 hour(s))  POCT HgB A1C     Status: Abnormal   Collection Time: 05/17/16  3:37 PM  Result Value Ref Range   Hemoglobin A1C 7.9%      Assessment & Plan  Problem List Items Addressed This Visit      Cardiovascular and Mediastinum   Essential hypertension     Endocrine   Diabetes ([place])   Relevant Medications   empagliflozin (JARDIANCE) 25 MG TABS tablet     Other   Obesity   Relevant Medications   empagliflozin (JARDIANCE) 25 MG TABS tablet    Other Visit Diagnoses    Diabetes mellitus without complication ([place])    -  Primary   Relevant Medications   empagliflozin (JARDIANCE) 25 MG TABS tablet   Other Relevant Orders   POCT HgB A1C (Completed)      Meds ordered this encounter  Medications  . empagliflozin (JARDIANCE) 25 MG TABS tablet    Sig: Take 25 mg by mouth daily.    Dispense:  30 tablet    Refill:  6   1. Diabetes mellitus without complication ([place]) Cont Janumet and Actos. - POCT HgB A1C - empagliflozin (JARDIANCE) 25 MG TABS tablet; Take 25 mg by mouth daily.  Dispense: 30 tablet; Refill: 6  2. Diabetes mellitus due to underlying condition with hyperosmolarity without coma, without long-term current use of insulin ([place])   3. Essential  hypertension cont Losartan  4. Class 3 obesity due to excess calories with body mass index (BMI) of 45.0 to 49.9 in adult, unspecified whether serious comorbidity present ([place]) Discus sed diet and activiy

## 2016-05-18 ENCOUNTER — Telehealth: Payer: Self-pay | Admitting: Family Medicine

## 2016-05-18 NOTE — Telephone Encounter (Signed)
Pt's wife, Clydie BraunKaren said [facility] [place] about a medication that was sent in yesterday.  She wasn't sure but thought it was an English as a second language teacherinsurance authorization.  Her call back number is [phone]

## 2016-05-18 NOTE — Telephone Encounter (Signed)
Patient's spouse notified PA processed and approved. Called pharmacy and claim went through.[place]

## 2016-06-01 ENCOUNTER — Telehealth: Payer: Self-pay | Admitting: Family Medicine

## 2016-06-01 NOTE — Telephone Encounter (Signed)
Patient's spouse advised.[place]

## 2016-06-01 NOTE — Telephone Encounter (Signed)
The extra urination is expected at the beginning.  If he is having skin irritation, it may have some fungal component.  Have him use OTC Lamisil cream to the irritated area twice a day.  If not improving, I need to see.  Want to avoid strong steroid creams around the penis if we can.-jh

## 2016-06-01 NOTE — Telephone Encounter (Signed)
The medication pt's was prescribed at last visit is making him urinate a lot and is causing irritation.  Something like this happened in the past and pt was given a steroid to clear it up.  He is not able to leave work to be seen today and asked if something could be called in.  Please call Clydie BraunKaren [phone]

## 2016-07-12 ENCOUNTER — Ambulatory Visit: Payer: PRIVATE HEALTH INSURANCE | Admitting: Family Medicine

## 2016-07-19 ENCOUNTER — Ambulatory Visit: Payer: PRIVATE HEALTH INSURANCE | Admitting: Family Medicine

## 2016-07-26 ENCOUNTER — Ambulatory Visit (INDEPENDENT_AMBULATORY_CARE_PROVIDER_SITE_OTHER): Payer: PRIVATE HEALTH INSURANCE | Admitting: Family Medicine

## 2016-07-26 ENCOUNTER — Encounter: Payer: Self-pay | Admitting: Family Medicine

## 2016-07-26 VITALS — BP 115/70 | HR 102 | Temp 97.8°F | Resp 16 | Ht 72.0 in | Wt 351.0 lb

## 2016-07-26 DIAGNOSIS — Z6841 Body Mass Index (BMI) 40.0 and over, adult: Secondary | ICD-10-CM

## 2016-07-26 DIAGNOSIS — E785 Hyperlipidemia, unspecified: Secondary | ICD-10-CM | POA: Diagnosis not present

## 2016-07-26 DIAGNOSIS — E119 Type 2 diabetes mellitus without complications: Secondary | ICD-10-CM | POA: Diagnosis not present

## 2016-07-26 DIAGNOSIS — M25512 Pain in left shoulder: Secondary | ICD-10-CM | POA: Diagnosis not present

## 2016-07-26 DIAGNOSIS — E1169 Type 2 diabetes mellitus with other specified complication: Secondary | ICD-10-CM | POA: Diagnosis not present

## 2016-07-26 DIAGNOSIS — G8929 Other chronic pain: Secondary | ICD-10-CM

## 2016-07-26 DIAGNOSIS — M25511 Pain in right shoulder: Secondary | ICD-10-CM

## 2016-07-26 MED ORDER — MELOXICAM 15 MG PO TABS: 15.0000 mg | ORAL_TABLET | Freq: Every day | ORAL | 5 refills | Status: DC

## 2016-07-26 NOTE — Assessment & Plan Note (Signed)
Stable, previously mild elevated TG Continue on Pravastatin 20mg  daily Check future fasting lipids

## 2016-07-26 NOTE — Patient Instructions (Signed)
Thank you for coming in to clinic today.  1.  TO DO LIST: - Ask [employer] insurance about:     - If you can have blood drawn at our lab [facility], or if they prefer [facility]     - Ask about Diabetes injectable medications (cost and coverage, preference)        - #1 Bydureon BCise (generic Exenatide) weekly inj (new pen, previously old pen is called "Bydureon" but it is harder to use)        - #2 Trulicity (Dulaglutide) weekly inj        - #3 Victoza (Liraglutide) daily inj  All of these help significantly with weight loss and better control diabetes, however your Jardiance may actually continue to be very helpful and we may delay this start for now  - Schedule new appointment with Dr Clydene PughWoodard (annual vision screening for diabetes) - [place] in [place]  Refilled Meloxicam 15mg  daily, prefer for this med to only be as needed for flares, take one daily for 2-3 weeks at a time only if needed and then take 1-2 weeks or more off without it in your system, concern with chronic use affecting your kidneys  Recommend to start taking Tylenol Extra Strength 500mg  tabs - take 1 to 2 tabs per dose (max 1000mg ) every 6-8 hours for pain max 24 hour daily dose is 6 tablets or 3000mg . In the future you can repeat the same everyday Tylenol course for 1-2 weeks at a time.  - This is safe to take with anti-inflammatory medicines  ------------------------------  You will be due for FASTING BLOOD WORK (no food or drink after midnight before, only water or coffee without cream/sugar on the morning of)  - Please go ahead and schedule a "Lab Only" visit in the morning at the clinic for lab draw in 2 months, in mid April 2018  - Make sure Lab Only appointment is at least 1-2 weeks before your next appointment, so that results will be available  For Lab Results, once available within 2-3 days of blood draw, you can can log in to MyChart online to view your results and a brief explanation. Also, we can discuss  results at next follow-up visit.  Please schedule a follow-up appointment with Dr. Althea CharonKaramalegos in 2 months for Annual Physical (after 09/21/16)  If you have any other questions or concerns, please feel free to call the clinic or send a message through MyChart. You may also schedule an earlier appointment if necessary.  Saralyn PilarAlexander Karamalegos, DO [facility]

## 2016-07-26 NOTE — Assessment & Plan Note (Addendum)
Chronic problem with weight gain difficulty losing weight, recently improved down 10 lbs back to baseline 350 lbs, attributed to new med Jardiance, and some dietary changes. - [facility]p [facility]  Plan: 1. Continue current med regimen - reviewed future consideration of GLP1 agent for wt loss if needed in setting of DM 2. Encouraged continue work on implementing healthy DM diet, limited portion, reviewed reduced carb plan, continue improve hydration, start regular exercise, treadmill 3. Follow-up 2 months, future fasting labs to be ordered CMET, A1c Lipids

## 2016-07-26 NOTE — Assessment & Plan Note (Signed)
Previously stable, well-controlled, recent worsening up to A1c 7.9 (04/2016) from prior 6.8 to 7, attributed to approx 10 lb wt gain. No known complications or hypoglycemia.  Plan:  1. Continue current therapy until due for next A1c to see progress - encouraged with reported results on SGLT2 Jardiance 25mg  daily (no further urinary complications), also continue Janumet 50-1000mg  BID, Actos 45mg  daily for now - discussion on next check A1c in 2 months at Annual Physical if well controlled A1c 7.0 or less, will discontinue Actos, then in future checks if inc A1c next step would be add GLP1 agent, and maybe can DC sitagliptin if needed for better weight management - patient to check with ins on GLP1 for preferred / cost 2. Encouraged continue work on implementing healthy DM diet, limited portion, reviewed reduced carb plan, continue improve hydration, start regular exercise, treadmill 3. Continue ARB, statin 4. Recommended scheduling annual DM eye exam, plans to see Dr Clydene PughWoodard locally 5. Follow-up 2 months - will order future fasting CMET, Lipids, A1c for physical

## 2016-07-26 NOTE — Progress Notes (Signed)
Subjective:    Patient ID: Colton Long, male    DOB: 27-Feb-1976, 41 y.o.   MRN: [id number]  Colton Long is a 41 y.o. male presenting on 07/26/2016 for Diabetes   HPI  CHRONIC DM, Type 2: Reports no new concerns, last visit 04/2016 he was started on Jardiance up to '25mg'$  daily, he has been hydrating better and now urine is more clear, he is voiding more frequently due to this medication. Initially he did admit to some localized penile irritation, used topical anti-fungal cream with improvement, now resolved. CBGs: Avg 110s (previously 130s), Low 80s-90 (may feel lightheaded, if more active). Checks CBGs daily, usually not fasting, 1-2 hr postprandial Meds: Jardiance '25mg'$  daily, Janumet (Sitagliptin-Metformin) 50-'1000mg'$  BID (previously on metformin for years, initially GI intolerance, now adjusted to this), Actos '45mg'$  daily (>10+ years) Reports good compliance. Tolerating well w/o side-effects Currently on ARB (Losartan) Lifestyle: Diet (considering but not strictly started mediterranean / high protein low carb diet, since last visit he has reduced portion size, admits difficulty with bread / potatoes, drinks mostly water, sometimes drinks diet soda occasionally) / Exercise (not regularly exercise, limited time due to work long hours, has treadmill at home, has not initiated regimen) - has not had DM eye exam recently, he plans to schedule upcoming  Denies hypoglycemia, polyuria, visual changes, numbness or tingling, UTIs  HYPERLIPIDEMIA: - Reports no concerns. Last lipid panel 02/2015, elevated TG otherwise normal LDL and HDL controlled on statin - Currently taking Pravastatin '20mg'$  daily, tolerating well without side effects or myalgias  MORBID OBESITY BMI 47 / WEIGHT MANAGEMENT: - Since last visit he has lost some wt back down 10 lbs to approx 350 lb, previously fluctuating weight, now seems to be at highest weight in years overall. Prior goal for wt down below 300 lbs - Has been to  [facility], met with nutritionist about 1.5 years ago, he is aware of what he needs to do, just working on implementing it  PMH Chronic Shoulder Pain, Arthritis, requesting refill Meloxicam '15mg'$  daily, takes most days, not taking Tylenol   Social History  Substance Use Topics  . Smoking status: Never Smoker  . Smokeless tobacco: Never Used  . Alcohol use 0.0 oz/week     Comment: occasional    Review of Systems Per HPI unless specifically indicated above     Objective:    BP 115/70   Pulse (!) 102   Temp 97.8 F (36.6 C) (Oral)   Resp 16   Ht 6' (1.829 m)   Wt (!) 351 lb (159.2 kg)   BMI 47.60 kg/m   Wt Readings from Last 3 Encounters:  07/26/16 (!) 351 lb (159.2 kg)  05/17/16 (!) 361 lb (163.7 kg)  02/29/16 (!) 352 lb (159.7 kg)    Physical Exam  Constitutional: He appears well-developed and well-nourished. No distress.  Well-appearing, comfortable, cooperative, very pleasant, obese  HENT:  Head: Normocephalic and atraumatic.  Mouth/Throat: Oropharynx is clear and moist.  Eyes: Conjunctivae are normal.  Neck: Normal range of motion. Neck supple.  Large neck  Cardiovascular: Normal rate, regular rhythm, normal heart sounds and intact distal pulses.   No murmur heard. Pulmonary/Chest: Effort normal.  Neurological: He is alert.  Skin: Skin is warm and dry. No rash noted. He is not diaphoretic. No erythema.  Psychiatric: He has a normal mood and affect. His behavior is normal.  Nursing note and vitals reviewed.  Results for orders placed or performed in visit on  05/17/16  POCT HgB A1C  Result Value Ref Range   Hemoglobin A1C 7.9%       Assessment & Plan:   Problem List Items Addressed This Visit    Type 2 diabetes mellitus without complications ([place]) - Primary    Previously stable, well-controlled, recent worsening up to A1c 7.9 (04/2016) from prior 6.8 to 7, attributed to approx 10 lb wt gain. No known complications or hypoglycemia.  Plan:  1.  Continue current therapy until due for next A1c to see progress - encouraged with reported results on SGLT2 Jardiance '25mg'$  daily (no further urinary complications), also continue Janumet 50-'1000mg'$  BID, Actos '45mg'$  daily for now - discussion on next check A1c in 2 months at Annual Physical if well controlled A1c 7.0 or less, will discontinue Actos, then in future checks if inc A1c next step would be add GLP1 agent, and maybe can DC sitagliptin if needed for better weight management - patient to check with ins on GLP1 for preferred / cost 2. Encouraged continue work on implementing healthy DM diet, limited portion, reviewed reduced carb plan, continue improve hydration, start regular exercise, treadmill 3. Continue ARB, statin 4. Recommended scheduling annual DM eye exam, plans to see Dr Ellin Mayhew locally 5. Follow-up 2 months - will order future fasting CMET, Lipids, A1c for physical      Morbid obesity with BMI of 45.0-49.9, adult ([place])    Chronic problem with weight gain difficulty losing weight, recently improved down 10 lbs back to baseline 350 lbs, attributed to new med Jardiance, and some dietary changes. - S/p [place] 2016  Plan: 1. Continue current med regimen - reviewed future consideration of GLP1 agent for wt loss if needed in setting of DM 2. Encouraged continue work on implementing healthy DM diet, limited portion, reviewed reduced carb plan, continue improve hydration, start regular exercise, treadmill 3. Follow-up 2 months, future fasting labs to be ordered CMET, A1c Lipids      Hyperlipidemia associated with type 2 diabetes mellitus ([place])    Stable, previously mild elevated TG Continue on Pravastatin '20mg'$  daily Check future fasting lipids      Chronic left shoulder pain    Not focus of visit today. Refilled chronic anti-inflammatory Meloxicam '15mg'$  daily, discussed concerns with chronic NSAID use, he was not taking Tylenol previously, recommend switch to regular  Tylenol and use Meloxicam only PRN flares for 1-3 weeks at a time, ideally can hold meloxicam for 1-2 weeks at a time or longer in future if not needed      Relevant Medications   meloxicam (MOBIC) 15 MG tablet      Meds ordered this encounter  Medications  . meloxicam (MOBIC) 15 MG tablet    Sig: Take 1 tablet (15 mg total) by mouth daily.    Dispense:  30 tablet    Refill:  5      Follow up plan: Return in about 2 months (around 09/23/2016) for Annual Physical.  Nobie Putnam, DO [facility] 07/26/2016, 5:19 PM

## 2016-07-26 NOTE — Assessment & Plan Note (Signed)
Not focus of visit today. Refilled chronic anti-inflammatory Meloxicam 15mg  daily, discussed concerns with chronic NSAID use, he was not taking Tylenol previously, recommend switch to regular Tylenol and use Meloxicam only PRN flares for 1-3 weeks at a time, ideally can hold meloxicam for 1-2 weeks at a time or longer in future if not needed

## 2016-08-14 ENCOUNTER — Other Ambulatory Visit: Payer: Self-pay | Admitting: Family Medicine

## 2016-09-23 ENCOUNTER — Other Ambulatory Visit: Payer: PRIVATE HEALTH INSURANCE

## 2016-09-27 ENCOUNTER — Encounter: Payer: PRIVATE HEALTH INSURANCE | Admitting: Family Medicine

## 2016-09-29 ENCOUNTER — Other Ambulatory Visit: Payer: Self-pay

## 2016-09-29 ENCOUNTER — Other Ambulatory Visit: Payer: Self-pay | Admitting: Family Medicine

## 2016-09-29 DIAGNOSIS — E559 Vitamin D deficiency, unspecified: Secondary | ICD-10-CM

## 2016-09-29 DIAGNOSIS — E785 Hyperlipidemia, unspecified: Secondary | ICD-10-CM

## 2016-09-29 DIAGNOSIS — E1169 Type 2 diabetes mellitus with other specified complication: Secondary | ICD-10-CM

## 2016-09-29 DIAGNOSIS — Z6841 Body Mass Index (BMI) 40.0 and over, adult: Secondary | ICD-10-CM

## 2016-09-29 DIAGNOSIS — E119 Type 2 diabetes mellitus without complications: Secondary | ICD-10-CM

## 2016-09-29 DIAGNOSIS — Z0000 Encounter for general adult medical examination without abnormal findings: Secondary | ICD-10-CM

## 2016-09-29 DIAGNOSIS — I10 Essential (primary) hypertension: Secondary | ICD-10-CM

## 2016-09-29 MED ORDER — PIOGLITAZONE HCL 45 MG PO TABS: 45.0000 mg | ORAL_TABLET | Freq: Every day | ORAL | 0 refills | Status: DC

## 2016-09-29 NOTE — Telephone Encounter (Signed)
Pharmacy requesting refill Last ov 07/27/15 Last filled 09/18/15

## 2016-09-29 NOTE — Telephone Encounter (Signed)
Refill only one month supply, as planned to discontinue at next appointment in 09/2016.

## 2016-10-03 ENCOUNTER — Other Ambulatory Visit: Payer: Self-pay | Admitting: Family Medicine

## 2016-10-03 DIAGNOSIS — G8929 Other chronic pain: Secondary | ICD-10-CM

## 2016-10-03 DIAGNOSIS — E119 Type 2 diabetes mellitus without complications: Secondary | ICD-10-CM

## 2016-10-03 DIAGNOSIS — M25512 Pain in left shoulder: Secondary | ICD-10-CM

## 2016-10-03 MED ORDER — MELOXICAM 15 MG PO TABS: 15.0000 mg | ORAL_TABLET | Freq: Every day | ORAL | 0 refills | Status: DC

## 2016-10-03 MED ORDER — EMPAGLIFLOZIN 25 MG PO TABS: 25.0000 mg | ORAL_TABLET | Freq: Every day | ORAL | 0 refills | Status: DC

## 2016-10-03 NOTE — Telephone Encounter (Signed)
Patient's wife called for refills on Jardiance, and Meloxicam Last ov  07/26/16, next appointment scheduled for 10/14/16

## 2016-10-06 ENCOUNTER — Other Ambulatory Visit: Payer: PRIVATE HEALTH INSURANCE

## 2016-10-07 ENCOUNTER — Other Ambulatory Visit: Payer: PRIVATE HEALTH INSURANCE

## 2016-10-12 ENCOUNTER — Encounter: Payer: PRIVATE HEALTH INSURANCE | Admitting: Family Medicine

## 2016-10-12 ENCOUNTER — Telehealth: Payer: Self-pay

## 2016-10-12 DIAGNOSIS — R451 Restlessness and agitation: Secondary | ICD-10-CM

## 2016-10-12 MED ORDER — FLUOXETINE HCL 20 MG PO CAPS: 20.0000 mg | ORAL_CAPSULE | Freq: Every day | ORAL | 0 refills | Status: DC

## 2016-10-12 NOTE — Telephone Encounter (Signed)
Attempted to contact pt wife to inform her the script was sent to her husband pharmacy. The pt needs to keep his appt scheduled for 5/30.

## 2016-10-12 NOTE — Telephone Encounter (Signed)
Pt wife called requesting a extension for the pt Fluoxetine HCL 20MG . Wife states, he took his last pill yesterday.  It looks as if the pt have cancelled the last 3 appts, but he currently have an appt scheduled for 10/26/16.

## 2016-10-12 NOTE — Telephone Encounter (Signed)
Cosigned fluoxetine refill, continue 20mg  daily #30 tabs 0 refills to last until upcoming apt 10/26/16  Saralyn PilarAlexander Karamalegos, DO [facility] 10/12/2016, 12:50 PM

## 2016-10-12 NOTE — Telephone Encounter (Signed)
Pt wife notified. No questions or concern.

## 2016-10-14 ENCOUNTER — Other Ambulatory Visit: Payer: PRIVATE HEALTH INSURANCE

## 2016-10-18 ENCOUNTER — Other Ambulatory Visit: Payer: Self-pay

## 2016-10-18 DIAGNOSIS — E119 Type 2 diabetes mellitus without complications: Secondary | ICD-10-CM

## 2016-10-18 MED ORDER — SITAGLIPTIN PHOS-METFORMIN HCL 50-1000 MG PO TABS: 1.0000 | ORAL_TABLET | Freq: Two times a day (BID) | ORAL | 3 refills | Status: DC

## 2016-10-20 ENCOUNTER — Other Ambulatory Visit: Payer: Self-pay | Admitting: Family Medicine

## 2016-10-20 ENCOUNTER — Other Ambulatory Visit: Payer: PRIVATE HEALTH INSURANCE

## 2016-10-20 LAB — COMPLETE METABOLIC PANEL WITH GFR
ALBUMIN: 3.9 g/dL (ref 3.6–5.1)
ALT: 29 U/L (ref 9–46)
AST: 17 U/L (ref 10–40)
Alkaline Phosphatase: 50 U/L (ref 40–115)
BILIRUBIN TOTAL: 0.6 mg/dL (ref 0.2–1.2)
BUN: 14 mg/dL (ref 7–25)
CALCIUM: 9.1 mg/dL (ref 8.6–10.3)
CO2: 26 mmol/L (ref 20–31)
Chloride: 104 mmol/L (ref 98–110)
Creat: 0.87 mg/dL (ref 0.60–1.35)
GFR, Est African American: >89 mL/min (ref >=60)
Glucose, Bld: 108 mg/dL — ABNORMAL HIGH (ref 65–99)
POTASSIUM: 4.8 mmol/L (ref 3.5–5.3)
Sodium: 138 mmol/L (ref 135–146)
Total Protein: 6.4 g/dL (ref 6.1–8.1)

## 2016-10-20 LAB — LIPID PANEL
CHOLESTEROL: 157 mg/dL (ref <200)
HDL: 47 mg/dL (ref >40)
LDL Cholesterol: 90 mg/dL (ref <100)
Total CHOL/HDL Ratio: 3.3 Ratio (ref <5.0)
Triglycerides: 102 mg/dL (ref <150)
VLDL: 20 mg/dL (ref <30)

## 2016-10-21 LAB — HEMOGLOBIN A1C
Hgb A1c MFr Bld: 6.7 % — ABNORMAL HIGH
Mean Plasma Glucose: 146 mg/dL

## 2016-10-21 LAB — VITAMIN D 25 HYDROXY (VIT D DEFICIENCY, FRACTURES): Vit D, 25-Hydroxy: 23 ng/mL — ABNORMAL LOW (ref 30–100)

## 2016-10-26 ENCOUNTER — Ambulatory Visit (INDEPENDENT_AMBULATORY_CARE_PROVIDER_SITE_OTHER): Payer: PRIVATE HEALTH INSURANCE | Admitting: Family Medicine

## 2016-10-26 VITALS — BP 103/70 | HR 110 | Temp 98.5°F | Resp 16 | Ht 72.0 in | Wt 347.0 lb

## 2016-10-26 DIAGNOSIS — E119 Type 2 diabetes mellitus without complications: Secondary | ICD-10-CM | POA: Diagnosis not present

## 2016-10-26 DIAGNOSIS — E559 Vitamin D deficiency, unspecified: Secondary | ICD-10-CM

## 2016-10-26 DIAGNOSIS — F419 Anxiety disorder, unspecified: Secondary | ICD-10-CM

## 2016-10-26 DIAGNOSIS — M25511 Pain in right shoulder: Secondary | ICD-10-CM

## 2016-10-26 DIAGNOSIS — G8929 Other chronic pain: Secondary | ICD-10-CM | POA: Diagnosis not present

## 2016-10-26 DIAGNOSIS — Z0000 Encounter for general adult medical examination without abnormal findings: Secondary | ICD-10-CM | POA: Diagnosis not present

## 2016-10-26 DIAGNOSIS — Z6841 Body Mass Index (BMI) 40.0 and over, adult: Secondary | ICD-10-CM

## 2016-10-26 MED ORDER — EMPAGLIFLOZIN 25 MG PO TABS: 25.0000 mg | ORAL_TABLET | Freq: Every day | ORAL | 3 refills | Status: DC

## 2016-10-26 MED ORDER — FLUOXETINE HCL 20 MG PO CAPS: 20.0000 mg | ORAL_CAPSULE | Freq: Every day | ORAL | 11 refills | Status: DC

## 2016-10-26 MED ORDER — MELOXICAM 15 MG PO TABS: 15.0000 mg | ORAL_TABLET | Freq: Every day | ORAL | 5 refills | Status: DC | PRN

## 2016-10-26 MED ORDER — BACLOFEN 10 MG PO TABS: 5.0000 mg | ORAL_TABLET | Freq: Three times a day (TID) | ORAL | 1 refills | Status: DC | PRN

## 2016-10-26 NOTE — Progress Notes (Signed)
Subjective:    Patient ID: Colton Long, male    DOB: 1975-06-04, 41 y.o.   MRN: [id number]  Colton Long is a 41 y.o. male presenting on 10/26/2016 for Annual Exam   HPI   Here for Annual Physical / Lab Review.  CHRONIC DM, Type 2: Reports no new concerns, CBGs: Avg 110s-120s, Low 80s-90 (more rare). Checks CBGs daily, usually not fasting, 1-2 hr postprandial Meds: Jardiance '25mg'$  daily, Janumet (Sitagliptin-Metformin) 50-'1000mg'$  BID (previously on metformin for years, initially GI intolerance, now adjusted to this), Actos '45mg'$  daily (>10+ years) Reports good compliance. Tolerating well w/o side-effects Currently on ARB (Losartan) Lifestyle: - Diet (no significant diet change since last visit, admits better food choices and reducing portion size, still considering change to mediterranean, drink mostly water, occasional diet soda. Admits he has some erratic eating schedule, often on run and not always making healthy choices) - Exercise (still not regularly exercise, limited time due to work long hours, has treadmill at home, has not initiated regimen) - has not had DM eye exam recently, he plans to schedule upcoming, Dr Ellin Mayhew - Denies hypoglycemia  HYPERLIPIDEMIA: - Reports no concerns. Last lipid panel 09/2016, significantly improved TG otherwise normal LDL and HDL controlled on statin - Currently taking Pravastatin '20mg'$  daily, tolerating well without side effects or myalgias  MORBID OBESITY BMI 47 Weight down 15 lbs in 6 months, improved on lifestyle. He has seen [facility] before. No new concerns.  Vitamin D Deficiency: - Last lab result 23, low, he has increased vitamin D3 supplement to 5k daily  Chronic Anxiety and Intermittent Stress - Prior history of anxiety >15-20 years ago, treated with Paxil in past,  - Currently taking Fluoxetine '20mg'$  daily, had been on from his prior PCP for several years ago, with good results  Chronic Shoulder Pain, Arthritis -  Gradually worsen problem over past 2 years, chronic R shoulder pain. - Today requesting refill Meloxicam '15mg'$  daily, takes most days, not taking Tylenol, has not tried to reduce down to infrequent dosing 1-2 weeks at a time only. Interested in alternative medicine such as muscle relaxant to help PRN pain, and help with sleep, has difficulty with R shoulder pain if lays on this side overnight wakes him up. - In past he was to [facility], for R shoulder, did not do any PT. Massage PRN some mobility improved  Health Maintenance: - UTD except eye exam, see above - UTD Vaccines, including pneumonia vaccine, TDAp - Not indicated for early prostate or colon cancer screening  Depression screen [facility] 2/9 10/26/2016 02/29/2016 01/05/2016  Decreased Interest 0 0 0  Down, Depressed, Hopeless 0 0 0  PHQ - 2 Score 0 0 0   GAD 7 : Generalized Anxiety Score 10/26/2016 02/29/2016  Nervous, Anxious, on Edge 0 0  Control/stop worrying 0 0  Worry too much - different things 1 0  Trouble relaxing 1 0  Restless 0 0  Easily annoyed or irritable 0 0  Afraid - awful might happen 0 0  Total GAD 7 Score 2 0  Anxiety Difficulty Not difficult at all Not difficult at all    Past Medical History:  Diagnosis Date  . Diabetes mellitus without complication ([place])   . Hypertension   . Sleep apnea    Past Surgical History:  Procedure Laterality Date  . CYSTECTOMY  4742,5956   head   . NASAL SEPTUM SURGERY  2005  . skin lesion exicision  2012   scalp  [facility]   Social History   Social History  . Marital status: Married    Spouse name: N/A  . Number of children: N/A  . Years of education: N/A   Occupational History  . Emergency planning/management officer    Social History Main Topics  . Smoking status: Never Smoker  . Smokeless tobacco: Never Used  . Alcohol use 0.0 oz/week     Comment: occasional  . Drug use: No  . Sexual activity: Not on file   Other Topics Concern  . Not on file   Social History  Narrative  . No narrative on file   Family History  Problem Relation Age of Onset  . Cancer Father        lung  . Heart disease Father   . Hypertension Father   . Diabetes Father   . Hypertension Mother   . Diabetes Mother    Current Outpatient Prescriptions on File Prior to Visit  Medication Sig  . Blood Glucose Monitoring Suppl (ONE TOUCH ULTRA SYSTEM KIT) w/Device KIT 1 kit by Does not apply route once.  . Cholecalciferol (VITAMIN D-3 PO) Take 1,000 Int'l Units by mouth daily.  Marland Kitchen glucose blood test strip 1 each by Other route as needed for other. Use as instructed  . losartan (COZAAR) 50 MG tablet take 1 tablet by mouth once daily  . pravastatin (PRAVACHOL) 20 MG tablet take 1 tablet by mouth once daily  . sitaGLIPtin-metformin (JANUMET) 50-1000 MG tablet Take 1 tablet by mouth 2 (two) times daily with a meal.   No current facility-administered medications on file prior to visit.     Review of Systems  Constitutional: Negative for activity change, appetite change, chills, diaphoresis, fatigue and fever.  HENT: Negative for congestion, hearing loss and sinus pressure.   Eyes: Negative for visual disturbance.  Respiratory: Negative for cough, chest tightness, shortness of breath and wheezing.   Cardiovascular: Negative for chest pain, palpitations and leg swelling.  Gastrointestinal: Negative for abdominal pain, constipation, diarrhea, nausea and vomiting.  Endocrine: Negative for cold intolerance and polyuria.  Genitourinary: Negative for decreased urine volume, difficulty urinating, dysuria, frequency and hematuria.  Musculoskeletal: Positive for arthralgias (R shoulder). Negative for back pain, joint swelling and neck pain.  Skin: Negative for rash.  Allergic/Immunologic: Negative for environmental allergies.  Neurological: Negative for dizziness, weakness, light-headedness, numbness and headaches.  Hematological: Negative for adenopathy.  Psychiatric/Behavioral: Negative  for behavioral problems, decreased concentration, dysphoric mood, self-injury, sleep disturbance and suicidal ideas. The patient is nervous/anxious (Improved).    Per HPI unless specifically indicated above     Objective:    BP 103/70   Pulse (!) 110   Temp 98.5 F (36.9 C) (Oral)   Resp 16   Ht 6' (1.829 m)   Wt (!) 347 lb (157.4 kg)   BMI 47.06 kg/m   Wt Readings from Last 3 Encounters:  10/26/16 (!) 347 lb (157.4 kg)  07/26/16 (!) 351 lb (159.2 kg)  05/17/16 (!) 361 lb (163.7 kg)    Physical Exam  Constitutional: He is oriented to person, place, and time. He appears well-developed and well-nourished. No distress.  Well-appearing, comfortable, cooperative, obese  HENT:  Head: Normocephalic and atraumatic.  Mouth/Throat: Oropharynx is clear and moist.  Frontal / maxillary sinuses non-tender. Nares patent without purulence or edema. Bilateral TMs clear without erythema, effusion or bulging. Oropharynx clear without erythema, exudates, edema or asymmetry.  Eyes: Conjunctivae and EOM are normal. Pupils are equal, round, and reactive to  light.  Neck: Normal range of motion. Neck supple. No thyromegaly present.  No carotid bruits  Cardiovascular: Normal rate, regular rhythm, normal heart sounds and intact distal pulses.   No murmur heard. Pulmonary/Chest: Effort normal and breath sounds normal. No respiratory distress. He has no wheezes. He has no rales.  Abdominal: Soft. Bowel sounds are normal. He exhibits no distension and no mass. There is no tenderness.  Musculoskeletal: He exhibits no edema.  Right Shoulder Inspection: Normal appearance bilateral symmetrical Palpation: Non-tender to palpation over anterior, lateral, or posterior shoulder  ROM: Reduced forward flexion >90* above shoulder and significant reduced internal ROM with pain Special Testing: Rotator cuff testing negative for weakness with supraspinatus full can and empty can test, Hawkin's AC impingement negative for  pain Strength: Normal strength 5/5 flex/ext, ext rot / int rot, grip, rotator cuff str testing. Neurovascular: Distally intact pulses, sensation to light touch  Lymphadenopathy:    He has no cervical adenopathy.  Neurological: He is alert and oriented to person, place, and time.  Skin: Skin is warm and dry. No rash noted. He is not diaphoretic.  Psychiatric: He has a normal mood and affect. His behavior is normal.  Well groomed, good eye contact, normal speech and thoughts  Nursing note and vitals reviewed.    Diabetic Foot Exam - Simple   Simple Foot Form Diabetic Foot exam was performed with the following findings:  Yes 10/26/2016  3:00 PM  Visual Inspection See comments:  Yes Sensation Testing Intact to touch and monofilament testing bilaterally:  Yes Pulse Check Posterior Tibialis and Dorsalis pulse intact bilaterally:  Yes Comments Bilateral plantar aspect with dry skin and callus formation and dry cracked skin. No ulcerations.     Results for orders placed or performed in visit on 10/20/16  COMPLETE METABOLIC PANEL WITH GFR  Result Value Ref Range   Sodium 138 135 - 146 mmol/L   Potassium 4.8 3.5 - 5.3 mmol/L   Chloride 104 98 - 110 mmol/L   CO2 26 20 - 31 mmol/L   Glucose, Bld 108 (H) 65 - 99 mg/dL   BUN 14 7 - 25 mg/dL   Creat 0.87 0.60 - 1.35 mg/dL   Total Bilirubin 0.6 0.2 - 1.2 mg/dL   Alkaline Phosphatase 50 40 - 115 U/L   AST 17 10 - 40 U/L   ALT 29 9 - 46 U/L   Total Protein 6.4 6.1 - 8.1 g/dL   Albumin 3.9 3.6 - 5.1 g/dL   Calcium 9.1 8.6 - 10.3 mg/dL   GFR, Est African American >89 >=60 mL/min   GFR, Est Non African American >89 >=60 mL/min  Lipid panel  Result Value Ref Range   Cholesterol 157 <200 mg/dL   Triglycerides 102 <150 mg/dL   HDL 47 >40 mg/dL   Total CHOL/HDL Ratio 3.3 <5.0 Ratio   VLDL 20 <30 mg/dL   LDL Cholesterol 90 <100 mg/dL  Hemoglobin A1c  Result Value Ref Range   Hgb A1c MFr Bld 6.7 (H) <5.7 %   Mean Plasma Glucose 146 mg/dL    VITAMIN D 25 Hydroxy (Vit-D Deficiency, Fractures)  Result Value Ref Range   Vit D, 25-Hydroxy 23 (L) 30 - 100 ng/mL      Assessment & Plan:   Problem List Items Addressed This Visit    Vitamin D deficiency    Improved Continue Vitamin D3 5,000 daily for now 8-12 weeks then daily supplement OTC Vitamin D3 2,000 daily  Type 2 diabetes mellitus without complications (HCC)    Significant improved controlled, A1c 6.7, prior 7.9. Weight loss No known complications or hypoglycemia.  Plan:  1. Discontinue Actos '45mg'$  daily 2. Continue other current therapy Jardiance '25mg'$  daily, Janumet 50-'1000mg'$  BID 3. Hold new GLP1 for now - counseled on this last visit and today, may need in future if elevated A1c 4. Encouraged continue work on implementing healthy DM diet, limited portion, reviewed reduced carb plan, continue improve hydration, start regular exercise, treadmill 5. Continue ARB, statin 6. Recommended scheduling annual DM eye exam, plans to see Dr Ellin Mayhew locally - DM foot exam done 7. Follow-up q 3 months for A1c trend DM      Relevant Medications   empagliflozin (JARDIANCE) 25 MG TABS tablet   Morbid obesity with BMI of 45.0-49.9, adult (HCC)    Improved wt loss down 15 lb in 6 months Encouraged continue current lifestyle plan diet exercise Follow-up as needed      Relevant Medications   empagliflozin (JARDIANCE) 25 MG TABS tablet   Chronic right shoulder pain    Consistent with chronic R-shoulder bursitis vs rotator cuff tendinopathy with some reduced active ROM but without significant evidence of muscle tear (no weakness). No known injury. No imaging.  Plan: 1. Reviewed conservative therapy, RICE etc - advised to start with mostly regular Tylenol dosing, start new muscle relaxant baclofen PRN new trial, and also refilled Meloxicam only use 1-2 weeks at a time PRN prefer to avoid if can 2. Handout given ROM exercises, counseling on avoid frozen shoulder 3. Follow-up as  needed likely 4 weeks to monitor progress, may need repeat referral to PT, consider steroid injection + X-ray imaging at next visit      Relevant Medications   FLUoxetine (PROZAC) 20 MG capsule   meloxicam (MOBIC) 15 MG tablet   baclofen (LIORESAL) 10 MG tablet   Anxiety disorder    Stable, controlled without panic Refilled Fluoxetine '20mg'$  daily Follow-up in future may consider tapering off SSRI      Relevant Medications   FLUoxetine (PROZAC) 20 MG capsule    Other Visit Diagnoses    Annual physical exam    -  Primary   Diabetes mellitus without complication ([place])       Relevant Medications   empagliflozin (JARDIANCE) 25 MG TABS tablet      Meds ordered this encounter  Medications  . FLUoxetine (PROZAC) 20 MG capsule    Sig: Take 1 capsule (20 mg total) by mouth daily.    Dispense:  30 capsule    Refill:  11  . empagliflozin (JARDIANCE) 25 MG TABS tablet    Sig: Take 25 mg by mouth daily.    Dispense:  90 tablet    Refill:  3  . meloxicam (MOBIC) 15 MG tablet    Sig: Take 1 tablet (15 mg total) by mouth daily as needed for pain. For 1-2 weeks at a time for flare    Dispense:  30 tablet    Refill:  5  . baclofen (LIORESAL) 10 MG tablet    Sig: Take 0.5-1 tablets (5-10 mg total) by mouth 3 (three) times daily as needed for muscle spasms.    Dispense:  30 each    Refill:  1    Follow up plan: Return in about 4 weeks (around 11/23/2016) for Right shoulder pain.  Nobie Putnam, [facility] 10/27/2016, 4:23 PM

## 2016-10-26 NOTE — Patient Instructions (Addendum)
Thank you for coming in to clinic today.  1.  Great job on A1c, now 6.7 - STOP Actos  Please schedule Annual Diabetic Eye Exam with Dr Clydene PughWoodard  Can still call insurance about injectable GLP1 medications - let me know if interested. As discussed, as long as A1c < 8.0, probably not as critical to start these.  --------  Continue Meloxicam 15mg  daily, prefer for this med to only be as needed for flares, take one daily for 2-3 weeks at a time only if needed and then take 1-2 weeks or more off without it in your system, concern with chronic use affecting your kidneys  Recommend to start taking Tylenol Extra Strength 500mg  tabs - take 1 to 2 tabs per dose (max 1000mg ) every 6-8 hours for pain max 24 hour daily dose is 6 tablets or 3000mg . In the future you can repeat the same everyday Tylenol course for 1-2 weeks at a time.  - This is safe to take with anti-inflammatory medicines  Start taking Baclofen (Lioresal) 10mg  (muscle relaxant) - start with half (cut) to one whole pill at night as needed for next 1-3 nights (may make you drowsy, caution with driving) see how it affects you, then if tolerated increase to one pill 2 to 3 times a day or (every 8 hours as needed)  Let me know if interested in Shoulder Steroid Injection, consider X-ray  ------------------------------  Follow-up:   If you have any other questions or concerns, please feel free to call the clinic or send a message through MyChart. You may also schedule an earlier appointment if necessary.  Colton PilarAlexander [name], DO [facility], [facility]  Range of Motion Shoulder Exercises  Pendulum Circles - Lean with your good arm against a counter or table for support [facility]- Bend forward with a wide stance (make sure your body is comfortable) - Your painful shoulder should hang down and feel "heavy" - Gently move your painful arm in small circles "clockwise" for several turns - Switch to "counterclockwise" for several  turns - Early on keep circles narrow and move slowly - Later in rehab, move in larger circles and faster movement   Wall Crawl - Stand close (about 1-2 ft away) to a wall, facing it directly - Reach out with your arm of painful shoulder and place fingers (not palm) on wall - You should make contact with wall at your waist level - Slowly walk your fingers up the wall. Stay in contact with wall entire time, do not remove fingers - Keep walking fingers up wall until you reach shoulder level - You may feel tightening or mild discomfort, once you reach a height that causes pain or if you are already above your shoulder height then stop. Repeat from starting position. - Early on stand closer to wall, move fingers slowly, and stay at or below shoulder level - Later in rehab, stand farther away from wall (fingertips), move fingers quicker, go above shoulder level

## 2016-10-27 ENCOUNTER — Encounter: Payer: Self-pay | Admitting: Family Medicine

## 2016-10-27 NOTE — Assessment & Plan Note (Signed)
Significant improved controlled, A1c 6.7, prior 7.9. Weight loss No known complications or hypoglycemia.  Plan:  1. Discontinue Actos 45mg  daily 2. Continue other current therapy Jardiance 25mg  daily, Janumet 50-1000mg  BID 3. Hold new GLP1 for now - counseled on this last visit and today, may need in future if elevated A1c 4. Encouraged continue work on implementing healthy DM diet, limited portion, reviewed reduced carb plan, continue improve hydration, start regular exercise, treadmill 5. Continue ARB, statin 6. Recommended scheduling annual DM eye exam, plans to see Dr Clydene PughWoodard locally - DM foot exam done 7. Follow-up q 3 months for A1c trend DM

## 2016-10-27 NOTE — Assessment & Plan Note (Signed)
Consistent with chronic R-shoulder bursitis vs rotator cuff tendinopathy with some reduced active ROM but without significant evidence of muscle tear (no weakness). No known injury. No imaging.  Plan: 1. Reviewed conservative therapy, RICE etc - advised to start with mostly regular Tylenol dosing, start new muscle relaxant baclofen PRN new trial, and also refilled Meloxicam only use 1-2 weeks at a time PRN prefer to avoid if can 2. Handout given ROM exercises, counseling on avoid frozen shoulder 3. Follow-up as needed likely 4 weeks to monitor progress, may need repeat referral to PT, consider steroid injection + X-ray imaging at next visit

## 2016-10-27 NOTE — Assessment & Plan Note (Signed)
Stable, controlled without panic Refilled Fluoxetine 20mg  daily Follow-up in future may consider tapering off SSRI

## 2016-10-27 NOTE — Assessment & Plan Note (Signed)
Improved Continue Vitamin D3 5,000 daily for now 8-12 weeks then daily supplement OTC Vitamin D3 2,000 daily

## 2016-10-27 NOTE — Assessment & Plan Note (Signed)
Improved wt loss down 15 lb in 6 months Encouraged continue current lifestyle plan diet exercise Follow-up as needed

## 2016-11-23 ENCOUNTER — Ambulatory Visit: Payer: PRIVATE HEALTH INSURANCE | Admitting: Family Medicine

## 2016-11-25 ENCOUNTER — Ambulatory Visit: Payer: PRIVATE HEALTH INSURANCE | Admitting: Family Medicine

## 2016-12-02 ENCOUNTER — Encounter: Payer: Self-pay | Admitting: Family Medicine

## 2016-12-02 ENCOUNTER — Ambulatory Visit (INDEPENDENT_AMBULATORY_CARE_PROVIDER_SITE_OTHER): Payer: PRIVATE HEALTH INSURANCE | Admitting: Family Medicine

## 2016-12-02 VITALS — BP 102/64 | HR 94 | Temp 98.7°F | Resp 16 | Ht 72.0 in | Wt 347.0 lb

## 2016-12-02 DIAGNOSIS — M25511 Pain in right shoulder: Secondary | ICD-10-CM

## 2016-12-02 DIAGNOSIS — G8929 Other chronic pain: Secondary | ICD-10-CM

## 2016-12-02 MED ORDER — LIDOCAINE HCL (PF) 1 % IJ SOLN
4.0000 mL | Freq: Once | INTRAMUSCULAR | Status: AC
  Administered 2016-12-02: 4 mL

## 2016-12-02 MED ORDER — METHYLPREDNISOLONE ACETATE 40 MG/ML IJ SUSP
40.0000 mg | Freq: Once | INTRAMUSCULAR | Status: AC
  Administered 2016-12-02: 40 mg via INTRA_ARTICULAR

## 2016-12-02 NOTE — Progress Notes (Signed)
Subjective:    Patient ID: Colton Long, male    DOB: December 27, 1975, 41 y.o.   MRN: [id number]  Colton Long is a 41 y.o. male presenting on 12/02/2016 for Shoulder Pain (right side)   HPI   Chronic Shoulder Pain, Arthritis - Last visit with me 10/26/16, for annual physical also evaluated for R shoulder pain, with suspected chronic bursitis, treated with new Baclofen PRN, conservative therapy inc max Tylenol, Meloxicam PRN, RICE therapy and ROM exercises, see prior notes for background information. - Interval update with taking increased Tylenol Ext Str 500mg  1-2 per dose 1-2x daily, Advil 200mg  1-2 per dose usually 1x daily not everyday, Baclofen 10mg  1-2x PRN. Not taking Meloxicam regularly but can try. - Today patient reports he has still had problems with R shoulder pain, some limitation worsening with range of motion forward flexion above >45* and worse above shoulder and behind back. At rest limited. Worse at night will wake up with pain at night - He is left handed, primarily uses L hand - Family history of osteoarthritis - Has not had prior imaging or X-ray - Interested in steroid injection today as discussed previously - In past he was referred to [facility], for R shoulder, but never scheduled apt - Denies any new injury, fall, trauma, other joint pain or swelling, fever, chills, erythema   Social History  Substance Use Topics  . Smoking status: Never Smoker  . Smokeless tobacco: Never Used  . Alcohol use 0.0 oz/week     Comment: occasional    Review of Systems Per HPI unless specifically indicated above     Objective:    BP 102/64   Pulse 94   Temp 98.7 F (37.1 C) (Oral)   Resp 16   Ht 6' (1.829 m)   Wt (!) 347 lb (157.4 kg)   BMI 47.06 kg/m   Wt Readings from Last 3 Encounters:  12/02/16 (!) 347 lb (157.4 kg)  10/26/16 (!) 347 lb (157.4 kg)  07/26/16 (!) 351 lb (159.2 kg)    Physical Exam  Constitutional: He is oriented to person,  place, and time. He appears well-developed and well-nourished. No distress.  Well-appearing, comfortable, cooperative, obese  HENT:  Mouth/Throat: Oropharynx is clear and moist.  Frontal / maxillary sinuses non-tender. Nares patent without purulence or edema. Bilateral TMs clear without erythema, effusion or bulging. Oropharynx clear without erythema, exudates, edema or asymmetry.  Eyes: Conjunctivae are normal.  Neck: Normal range of motion.  Cardiovascular: Normal rate and intact distal pulses.   Pulmonary/Chest: Effort normal.  Musculoskeletal: He exhibits no edema.  Right Shoulder - unchanged from last visit. Did not repeat specific rotator cuff testing  Inspection: Normal appearance bilateral symmetrical Palpation: Non-tender to palpation over anterior, lateral, or posterior shoulder  ROM: Reduced forward flexion >90* above shoulder and significant reduced internal ROM with pain Neurovascular: Distally intact pulses, sensation to light touch  (copy of previous exam not repeated today Special Testing: Rotator cuff testing negative for weakness with supraspinatus full can and empty can test, Hawkin's AC impingement negative for pain Strength: Normal strength 5/5 flex/ext, ext rot / int rot, grip, rotator cuff str testing.)  Neurological: He is alert and oriented to person, place, and time.  Skin: Skin is warm and dry. No rash noted. He is not diaphoretic. No erythema.  Psychiatric: His behavior is normal.  Nursing note and vitals reviewed.     ________________________________________________________ PROCEDURE NOTE Date: 12/02/16 Right Subacromial Shoulder joint steroid injection  Discussed benefits and risks (including pain, bleeding, infection, steroid flare). Verbal consent given by patient. Medication:  1 cc Depo-medrol 40mg  and 4 cc Lidocaine 1% without epi Time Out taken  Landmarks identified. Area cleansed with alcohol wipes.Using 21 gauge and 1, 1/2 inch needle, Right  subacromial bursa space was injected (with above listed medication) via posterior approach, cold spray used for superficial anesthetic.Sterile bandage placed.Patient tolerated procedure well without bleeding or paresthesias. He was able to lift his Right arm above shoulder without pain immediately after injection.No complications.   Results for orders placed or performed in visit on 10/20/16  COMPLETE METABOLIC PANEL WITH GFR  Result Value Ref Range   Sodium 138 135 - 146 mmol/L   Potassium 4.8 3.5 - 5.3 mmol/L   Chloride 104 98 - 110 mmol/L   CO2 26 20 - 31 mmol/L   Glucose, Bld 108 (H) 65 - 99 mg/dL   BUN 14 7 - 25 mg/dL   Creat 1.190.87 1.470.60 - 8.291.35 mg/dL   Total Bilirubin 0.6 0.2 - 1.2 mg/dL   Alkaline Phosphatase 50 40 - 115 U/L   AST 17 10 - 40 U/L   ALT 29 9 - 46 U/L   Total Protein 6.4 6.1 - 8.1 g/dL   Albumin 3.9 3.6 - 5.1 g/dL   Calcium 9.1 8.6 - 56.210.3 mg/dL   GFR, Est African American >89 >=60 mL/min   GFR, Est Non African American >89 >=60 mL/min  Lipid panel  Result Value Ref Range   Cholesterol 157 <200 mg/dL   Triglycerides 130102 <865<150 mg/dL   HDL 47 >78>40 mg/dL   Total CHOL/HDL Ratio 3.3 <5.0 Ratio   VLDL 20 <30 mg/dL   LDL Cholesterol 90 <469<100 mg/dL  Hemoglobin G2XA1c  Result Value Ref Range   Hgb A1c MFr Bld 6.7 (H) <5.7 %   Mean Plasma Glucose 146 mg/dL  VITAMIN D 25 Hydroxy (Vit-D Deficiency, Fractures)  Result Value Ref Range   Vit D, 25-Hydroxy 23 (L) 30 - 100 ng/mL      Assessment & Plan:   Problem List Items Addressed This Visit    Chronic right shoulder pain - Primary    Mild to limited improvement in chronic R-shoulder pain on max med conservative therapy, suspected still bursitis vs rotator cuff tendinopathy with some reduced active ROM but without significant evidence of muscle tear (no weakness). No known injury - No prior imaging  Plan: 1.R shoulder subacromial steroid injection today, see procedure note and AVS for aftercare, tolerated well, even had  improved ROM without pain after injection - will help diagnose / treat, if improved more likely bursitis, if not improved concern tendinopathy  2. Continue Baclofen, Tylenol, Meloxicam PRN 3. Future order R shoulder X-ray ordered - patient to return next week for this, since unavailable X-ray today on Friday 4. Contact office within 2-3 weeks if gradual improve - consider PT, [facility], if not improved then anticipate referral to [facility] 5. Follow-up 6 weeks re-eval      Relevant Medications   methylPREDNISolone acetate (DEPO-MEDROL) injection 40 mg (Completed)   lidocaine (PF) (XYLOCAINE) 1 % injection 4 mL (Completed)   Other Relevant Orders   DG Shoulder Right      Meds ordered this encounter  Medications  . methylPREDNISolone acetate (DEPO-MEDROL) injection 40 mg  . lidocaine (PF) (XYLOCAINE) 1 % injection 4 mL      Follow up plan: Return in about 6 weeks (around 01/13/2017) for  Right Shoulder Pain.  Saralyn Pilar, DO [facility] 12/02/2016, 8:42 PM

## 2016-12-02 NOTE — Patient Instructions (Addendum)
Thank you for coming to the clinic today.  1. You received a Right Shoulder Joint steroid injection today. - Lidocaine numbing medicine may ease the pain initially for a few hours until it wears off - As discussed, you may experience a "steroid flare" this evening or within 24-48 hours, anytime medicine is injected into an inflamed joint it can cause the pain to get worse temporarily - Everyone responds differently to these injections, it depends on the patient and the severity of the joint problem, it may provide anywhere from days to weeks, to months of relief. Ideal response is >6 months relief - Try to take it easy for next 1-2 days, avoid over activity and strain on joint (limit lifting for shoulder) - Recommend the following:   - For swelling - rest, compression sleeve / ACE wrap, elevation, and ice packs as needed for first few days   - For pain in future may use heating pad or moist heat as needed  Medication - No changes to current medicine, may continue with Tylenol, Baclofen - prefer to try Meloxicam 15mg  once daily for 1-2 weeks at a time or only as needed, instead of Advil  - Given the duration of your pain/injury, I am concerned for torn rotator cuff muscle in your shoulder. If not improving as expected over next several weeks, please follow-up sooner for re-evaluation. Consider referral to Orthopedics.  [facility]out given today for [facility] - only call to schedule after we discuss in few weeks if feel this would be beneficial.  IF needed, if we decide to refer to Ortho - this is where  [facility] Address: [address] [place], [place] [address] Hours:  9AM-5PM Phone: [phone]  Please call to give update after injection in 2-3 weeks - will determine if need PT vs Ortho at that point.  Please schedule a Follow-up Appointment to: Return in about 6 weeks (around 01/13/2017) for Right Shoulder Pain.  If you have any other  questions or concerns, please feel free to call the clinic or send a message through MyChart. You may also schedule an earlier appointment if necessary.  Additionally, you may be receiving a survey about your experience at our clinic within a few days to 1 week by e-mail or mail. We value your feedback.  Saralyn PilarAlexander Kourtlynn Trevor, DO [facility]

## 2016-12-02 NOTE — Assessment & Plan Note (Signed)
Mild to limited improvement in chronic R-shoulder pain on max med conservative therapy, suspected still bursitis vs rotator cuff tendinopathy with some reduced active ROM but without significant evidence of muscle tear (no weakness). No known injury - No prior imaging  Plan: 1.R shoulder subacromial steroid injection today, see procedure note and AVS for aftercare, tolerated well, even had improved ROM without pain after injection - will help diagnose / treat, if improved more likely bursitis, if not improved concern tendinopathy  2. Continue Baclofen, Tylenol, Meloxicam PRN 3. Future order R shoulder X-ray ordered - patient to return next week for this, since unavailable X-ray today on Friday 4. Contact office within 2-3 weeks if gradual improve - consider PT, [facility], if not improved then anticipate referral to [facility] 5. Follow-up 6 weeks re-eval

## 2016-12-20 ENCOUNTER — Other Ambulatory Visit: Payer: Self-pay | Admitting: Family Medicine

## 2016-12-20 DIAGNOSIS — E119 Type 2 diabetes mellitus without complications: Secondary | ICD-10-CM

## 2016-12-20 DIAGNOSIS — G8929 Other chronic pain: Secondary | ICD-10-CM

## 2016-12-20 DIAGNOSIS — M25511 Pain in right shoulder: Principal | ICD-10-CM

## 2016-12-27 LAB — HEMOGLOBIN A1C: HEMOGLOBIN A1C: 6.2

## 2016-12-28 ENCOUNTER — Ambulatory Visit (INDEPENDENT_AMBULATORY_CARE_PROVIDER_SITE_OTHER): Payer: PRIVATE HEALTH INSURANCE | Admitting: Family Medicine

## 2016-12-28 ENCOUNTER — Encounter: Payer: Self-pay | Admitting: Family Medicine

## 2016-12-28 VITALS — BP 111/75 | HR 108 | Temp 98.5°F | Resp 16 | Ht 72.0 in | Wt 340.0 lb

## 2016-12-28 DIAGNOSIS — H6982 Other specified disorders of Eustachian tube, left ear: Secondary | ICD-10-CM

## 2016-12-28 DIAGNOSIS — H6692 Otitis media, unspecified, left ear: Secondary | ICD-10-CM | POA: Diagnosis not present

## 2016-12-28 DIAGNOSIS — J309 Allergic rhinitis, unspecified: Secondary | ICD-10-CM | POA: Insufficient documentation

## 2016-12-28 DIAGNOSIS — J3089 Other allergic rhinitis: Secondary | ICD-10-CM

## 2016-12-28 MED ORDER — FLUTICASONE PROPIONATE 50 MCG/ACT NA SUSP: 2.0000 | Freq: Every day | NASAL | 3 refills | Status: AC

## 2016-12-28 MED ORDER — AMOXICILLIN 500 MG PO CAPS: 500.0000 mg | ORAL_CAPSULE | Freq: Two times a day (BID) | ORAL | 0 refills | Status: DC

## 2016-12-28 NOTE — Patient Instructions (Addendum)
Thank you for coming to the clinic today.  1. Possible L ear infection forming Also could be more from Eustachian Tube Dysfunction, this can cause fluid buildup behind ear may be from sinuses  Start nasal steroid Flonase 2 sprays in each nostril daily for 4-6 weeks, may repeat course seasonally or as needed  May try OTC Oral Decongestant (Phenylephrine) as needed if not improved within 24-48 hours if worsening  Please schedule a Follow-up Appointment to: Return in about 6 weeks (around 02/08/2017) for Right Shoulder pain / L ear.  If you have any other questions or concerns, please feel free to call the clinic or send a message through MyChart. You may also schedule an earlier appointment if necessary.  Additionally, you may be receiving a survey about your experience at our clinic within a few days to 1 week by e-mail or mail. We value your feedback.  Saralyn PilarAlexander Jaretzy Lhommedieu, DO [facility], [facility]

## 2016-12-28 NOTE — Assessment & Plan Note (Signed)
Likely contributing factor to some rhinitis/sinus symptoms maybe leading to L eustachian tube dysfunction - Start Flonase

## 2016-12-28 NOTE — Progress Notes (Signed)
Subjective:    Patient ID: Colton Long, male    DOB: Sep 28, 1975, 41 y.o.   MRN: [id number]  Colton Long is a 41 y.o. male presenting on 12/28/2016 for Ear Pain (left side onset 2-3 days)  Patient presents for a same day appointment.  HPI   Left Ear Pain / Pressure: - Reports symptoms started 2-3 days ago with Left ear pain, initially mild sensation of sharper pain, worse with certain movements and yawn, cough, also feels some pressure inside ear, and a fullness, like something like "water trapped" harder to get water out of ear after shower. - No recent exposure with submerged head or ear - History of L sided parotiditis with swollen gland, resolved promptly after eating sour food as advised - Admits some ear popping - Denies any hearing loss L ear, fevers/chills, ear drainage, swelling, sinus pain or pressure, significant sinus congestion, cough  Additonal update - h/o chronic R shoulder pain with arthritis/bursitis, last seen 12/02/16 s/p R shoulder subacromial injection, has had dramatic improvement, now mostly full range of motion without pain, has been doing exercises and pleased with result.  Social History  Substance Use Topics  . Smoking status: Never Smoker  . Smokeless tobacco: Never Used  . Alcohol use 0.0 oz/week     Comment: occasional    Review of Systems Per HPI unless specifically indicated above     Objective:    BP 111/75   Pulse (!) 108   Temp 98.5 F (36.9 C) (Oral)   Resp 16   Ht 6' (1.829 m)   Wt (!) 340 lb (154.2 kg)   BMI 46.11 kg/m   Wt Readings from Last 3 Encounters:  12/28/16 (!) 340 lb (154.2 kg)  12/02/16 (!) 347 lb (157.4 kg)  10/26/16 (!) 347 lb (157.4 kg)    Physical Exam  Constitutional: He appears well-developed and well-nourished. No distress.  Well-appearing, comfortable, cooperative  HENT:  Head: Normocephalic and atraumatic.  Mouth/Throat: Oropharynx is clear and moist.  Frontal / maxillary sinuses non-tender.  Nares  patent with mild congestion but no purulence or edema.  L TM with mild erythema and fullness with opaque fluid behind TM, non purulent. No bulging. Ear canal appears normal w/o abrasion or debris. Exernal ear and mastoid non tender. L mandible without clicking or popping or pain.  R TM mostly normal appearance without effusion, erythema.  Oropharynx clear without erythema, exudates, edema or asymmetry.  Eyes: Conjunctivae are normal. Right eye exhibits no discharge. Left eye exhibits no discharge.  Neck: Normal range of motion. Neck supple.  Cardiovascular: Normal rate, regular rhythm, normal heart sounds and intact distal pulses.   No murmur heard. Pulmonary/Chest: Effort normal and breath sounds normal. No respiratory distress. He has no wheezes. He has no rales.  Musculoskeletal: Normal range of motion. He exhibits no edema.  Significantly improved R shoulder ROM without limitation or pain. Full shoulder exam was not performed  Lymphadenopathy:    He has no cervical adenopathy.  Neurological: He is alert.  Skin: Skin is warm and dry. No rash noted. He is not diaphoretic. No erythema.  Nursing note and vitals reviewed.    Results for orders placed or performed in visit on 12/28/16  Hemoglobin A1c  Result Value Ref Range   Hemoglobin A1C 6.2     Recent Labs  05/17/16 1537 10/20/16 0807 12/27/16  HGBA1C 7.9% 6.7* 6.2        Assessment & Plan:   Problem List Items  Addressed This Visit    Allergic rhinitis due to allergen    Likely contributing factor to some rhinitis/sinus symptoms maybe leading to L eustachian tube dysfunction - Start Flonase      Relevant Medications   fluticasone (FLONASE) 50 MCG/ACT nasal spray    Other Visit Diagnoses    Left acute otitis media    -  Primary  Consistent with L AOM on exam with opaque effusion - Limited URI symptoms, likely early onset or secondary to L eustachian tube dysfunction Currently afebrile, well-appearing and  non-toxic, well hydrated on exam.  Plan: 1. Start Amoxicillin 500mg  PO BID x 7 days 2. Treat underlying ETD/Rhinitis - Flonase, loratadine, may try add phenylephrine OTC decongestant if not improving. 3. Return criteria given    Relevant Medications   amoxicillin (AMOXIL) 500 MG capsule   Eustachian tube dysfunction, left          Meds ordered this encounter  Medications  . amoxicillin (AMOXIL) 500 MG capsule    Sig: Take 1 capsule (500 mg total) by mouth 2 (two) times daily. For 7 days    Dispense:  14 capsule    Refill:  0  . fluticasone (FLONASE) 50 MCG/ACT nasal spray    Sig: Place 2 sprays into both nostrils daily. Use for 4-6 weeks then stop and use seasonally or as needed.    Dispense:  16 g    Refill:  3    Follow up plan: Return in about 6 weeks (around 02/08/2017) for Right Shoulder pain / L ear.  Saralyn PilarAlexander Onyx Edgley, DO [facility] 12/28/2016, 10:55 PM

## 2017-02-14 ENCOUNTER — Ambulatory Visit: Payer: PRIVATE HEALTH INSURANCE | Admitting: Family Medicine

## 2017-07-26 ENCOUNTER — Encounter: Payer: Self-pay | Admitting: Family Medicine

## 2017-07-26 ENCOUNTER — Other Ambulatory Visit: Payer: Self-pay | Admitting: Family Medicine

## 2017-07-26 ENCOUNTER — Ambulatory Visit: Payer: PRIVATE HEALTH INSURANCE | Admitting: Family Medicine

## 2017-07-26 VITALS — BP 121/78 | HR 80 | Temp 98.4°F | Resp 16 | Ht 72.0 in | Wt 327.0 lb

## 2017-07-26 DIAGNOSIS — L821 Other seborrheic keratosis: Secondary | ICD-10-CM | POA: Diagnosis not present

## 2017-07-26 DIAGNOSIS — M25511 Pain in right shoulder: Secondary | ICD-10-CM | POA: Diagnosis not present

## 2017-07-26 DIAGNOSIS — L918 Other hypertrophic disorders of the skin: Secondary | ICD-10-CM | POA: Diagnosis not present

## 2017-07-26 DIAGNOSIS — E559 Vitamin D deficiency, unspecified: Secondary | ICD-10-CM

## 2017-07-26 DIAGNOSIS — I10 Essential (primary) hypertension: Secondary | ICD-10-CM

## 2017-07-26 DIAGNOSIS — Z6841 Body Mass Index (BMI) 40.0 and over, adult: Secondary | ICD-10-CM

## 2017-07-26 DIAGNOSIS — E119 Type 2 diabetes mellitus without complications: Secondary | ICD-10-CM | POA: Diagnosis not present

## 2017-07-26 DIAGNOSIS — G8929 Other chronic pain: Secondary | ICD-10-CM | POA: Diagnosis not present

## 2017-07-26 DIAGNOSIS — Z114 Encounter for screening for human immunodeficiency virus [HIV]: Secondary | ICD-10-CM

## 2017-07-26 DIAGNOSIS — E785 Hyperlipidemia, unspecified: Secondary | ICD-10-CM

## 2017-07-26 DIAGNOSIS — E1169 Type 2 diabetes mellitus with other specified complication: Secondary | ICD-10-CM

## 2017-07-26 DIAGNOSIS — Z0000 Encounter for general adult medical examination without abnormal findings: Secondary | ICD-10-CM

## 2017-07-26 LAB — POCT GLYCOSYLATED HEMOGLOBIN (HGB A1C): Hemoglobin A1C: 8 — AB (ref ?–5.7)

## 2017-07-26 MED ORDER — METFORMIN HCL 1000 MG PO TABS: 1000.0000 mg | ORAL_TABLET | Freq: Two times a day (BID) | ORAL | 3 refills | Status: DC

## 2017-07-26 MED ORDER — SEMAGLUTIDE(0.25 OR 0.5MG/DOS) 2 MG/1.5ML ~~LOC~~ SOPN: 0.5000 mg | PEN_INJECTOR | SUBCUTANEOUS | 2 refills | Status: DC

## 2017-07-26 MED ORDER — INSULIN PEN NEEDLE 32G X 4 MM MISC: 3 refills | Status: AC

## 2017-07-26 NOTE — Assessment & Plan Note (Signed)
Resolved s/p subacromial steroid injection Follow-up if recurrence

## 2017-07-26 NOTE — Patient Instructions (Addendum)
Thank you for coming to the office today.  1.  Stopped Jardiance  Started Ozepmic (Semaglutide injection) - start 0.25mg  weekly for 4 weeks then increase to 0.5mg  weekly - This one has best benefit of weight loss and reducing Cardiovascular events  In future may adjust dose further  May have some nausea upset stomach initially, take AFTER meal  Pick day of week and continue each week on that day  Stop Janumet - START regular Metformin new rx 1000mg  twice daily  (other meds if needed in future due to cost / coverage)  2. Bydureon BCise (Exenatide ER) - once weekly - this is my preference, very good medicine well tolerated, less side effects of nausea, upset stomach. No dose changes. Cost and coverage is the problem, but we may be able to get it with the coupon card  3. Trulicity (Dulaglutide) - once weekly - this is very good one, usually one of my top choices as well, two doses, 0.75 (likely we would start) and 1.5 max dose. We can use coupon card here too  4. Victoza (Liraglutide) - once DAILY - 3 dose changes 0.6, 1.2 and 1.8, side effects nausea, upset stomach higher on this one but it is still very effective medicine   DERMATOLOGY  Referral sent - stay tuned, if not heard back call them  [facility]   [address] [place], [place] [address] Hours: 8AM-5PM Phone: [phone]  Armida Sansavid Kowalski, MD   DUE for FASTING BLOOD WORK (no food or drink after midnight before the lab appointment, only water or coffee without cream/sugar on the morning of)  SCHEDULE "Lab Only" visit in the morning at the clinic for lab draw in 3 MONTHS (after 10/20/17)  - Make sure Lab Only appointment is at about 1 week before your next appointment, so that results will be available  For Lab Results, once available within 2-3 days of blood draw, you can can log in to MyChart online to view your results and a brief explanation. Also, we can discuss results at next follow-up  visit.   Please schedule a Follow-up Appointment to: Return in about 3 months (around 10/23/2017) for Annual Physical.    If you have any other questions or concerns, please feel free to call the office or send a message through MyChart. You may also schedule an earlier appointment if necessary.  Additionally, you may be receiving a survey about your experience at our office within a few days to 1 week by e-mail or mail. We value your feedback.  Saralyn PilarAlexander Lorella Gomez, DO [facility], [facility]

## 2017-07-26 NOTE — Assessment & Plan Note (Addendum)
Sigificantly elevated A1c 8.0, from prior 6.2, despite improved diet/lifestyle wt loss Suspect secondary to self discontinued meds - stopped Jardiance, prior DC'd Actos No known complications or hypoglycemia. Failed SGL2 (jardiance - UTI)  Plan:  1. Discussion on adding new med instead of Jardiance - discussed GLP1 again - agreed on new start Ozempic GLP1 0.25mg  weekly [place] inj x 4 weeks then inc to 0.5mg  weekly inj - rx sent and copay card given, counseling on benefits including wt loss and risks side effects - STOP Janumet - due to ineffect DPP4 with GLP1 - NEW rx Metformin 1000mg  BID to replace Janumet Encouraged continue work on implementing healthy DM diet, limited portion, reviewed reduced carb plan, continue improve hydration, start regular exercise, treadmill Continue ARB, statin Recommended scheduling annual DM eye exam, plans to see Dr Clydene PughWoodard locally Follow-up q 3 months for annual + A1c - may need dose titrate up to 1mg  weekly

## 2017-07-26 NOTE — Progress Notes (Signed)
Subjective:    Patient ID: [place], male    DOB: [birth year], 42 y.o.   MRN: [id number]  [place] Long is a 42 y.o. male presenting on 07/26/2017 for Diabetes   HPI   CHRONIC DM, Type 2: Since last visit at Annual 09/2016, interval history we have discontinued Actos 45mg  daily due to improved A1c with lifestyle changes and avoid future side effects, he was continued on Jardiance 25mg  daily and Janumet 50-1000mg  daily BID Today he reports since last visit over past >1-2 months he has self discontinued Jardiance 25mg  daily due to urinary side effects with possible dysuria or yeast infection, has since resolved when he stopped the med, he did not seek treatment for this. CBGs: Avg 120-130s, Low >90 (rarely). Checks CBGs daily, usually not fasting, 1-2 hr postprandial Meds: Janumet (Sitagliptin-Metformin) 50-1000mg  BID (w/o GI intolerance) Reports good compliance. Tolerating well w/o side-effects Currently on ARB (Losartan) Lifestyle: - Diet (See below, reduced carbs, reduce portion improved) - Exercise (still not regularly exercise, limited time due to work long hours, has treadmill at home) - He plans to schedule DM Eye Exam w/ Dr Clydene PughWoodard - Denies hypoglycemia  MORBID OBESITY BMI >44 Since last visit significantly improved weight loss, reduced carbs and portion size, less snacks, lost >20 lbs in 6 months. He is still trying to work on improving exercise  Abnormal Skin Lesion / Skin Tag Reports two skin spots he wanted checked out, has an "abnormal mole" on left flank and a skin tag on R flank. Denies pain, bleeding,  Itching, changing. He thinks the spot on L side has developed more recently, uncertain timeline. No personal history of skin cancer or abnormal moles, used to see Dermatology many years ago. His father has history of pre-cancer lesions frozen on face before. - Today he is requesting referral to Derm  FOLLOW-UP R Shoulder Pain - Last visit with me 12/02/16, for same  problem, treated with R shoulder subacromial steroid injection, previously was on baclofen and conservative therapy RICE, NSAID, see prior notes for background information. - Interval update with dramatic improvement and resolution of R shoulder pain after injection, no longer taking medicines - Today patient reports doing well, no new concerns. Stopped Baclofen and NSAID - Did not receive X-ray  Health Maintenance: Due routine HIV screening on next lab will get with next labs  Depression screen [facility] 2/9 10/26/2016 02/29/2016 01/05/2016  Decreased Interest 0 0 0  Down, Depressed, Hopeless 0 0 0  PHQ - 2 Score 0 0 0    Social History   Tobacco Use  . Smoking status: Never Smoker  . Smokeless tobacco: Never Used  Substance Use Topics  . Alcohol use: Yes    Alcohol/week: 0.0 oz    Comment: occasional  . Drug use: No    Review of Systems Per HPI unless specifically indicated above     Objective:    BP 121/78   Pulse 80   Temp 98.4 F (36.9 C) (Oral)   Resp 16   Ht 6' (1.829 m)   Wt (!) 327 lb (148.3 kg)   BMI 44.35 kg/m   Wt Readings from Last 3 Encounters:  07/26/17 (!) 327 lb (148.3 kg)  12/28/16 (!) 340 lb (154.2 kg)  12/02/16 (!) 347 lb (157.4 kg)    Physical Exam  Constitutional: He is oriented to person, place, and time. He appears well-developed and well-nourished. No distress.  Well-appearing, comfortable, cooperative, obese  HENT:  Head: Normocephalic and  atraumatic.  Mouth/Throat: Oropharynx is clear and moist.  Eyes: Conjunctivae are normal. Right eye exhibits no discharge. Left eye exhibits no discharge.  Neck: Normal range of motion. Neck supple. No thyromegaly present.  Cardiovascular: Normal rate, regular rhythm, normal heart sounds and intact distal pulses.  No murmur heard. Pulmonary/Chest: Effort normal and breath sounds normal. No respiratory distress. He has no wheezes. He has no rales.  Musculoskeletal: Normal range of motion. He exhibits no edema.   Lymphadenopathy:    He has no cervical adenopathy.  Neurological: He is alert and oriented to person, place, and time.  Skin: Skin is warm and dry. No rash noted. He is not diaphoretic. No erythema.  Left flank back with 1 cm SK stuck on appearance with slight irritation.  R flank back area with 1 cm approx fleshy soft skin tag, non inflamed  Psychiatric: He has a normal mood and affect. His behavior is normal.  Well groomed, good eye contact, normal speech and thoughts  Nursing note and vitals reviewed.  Results for orders placed or performed in visit on 07/26/17  POCT HgB A1C  Result Value Ref Range   Hemoglobin A1C 8.0 (A) 5.7   Recent Labs    10/20/16 0807 12/27/16 07/26/17 0837  HGBA1C 6.7* 6.2 8.0*       Assessment & Plan:   Problem List Items Addressed This Visit    RESOLVED: Chronic right shoulder pain    Resolved s/p subacromial steroid injection Follow-up if recurrence      Morbid obesity with BMI of 45.0-49.9, adult (HCC)    Still improved wt loss >20lbs in 6 months, improved diet Goal to improve regular exercise several days weekly Continue >1 lb wt loss per week Start GLP1 with benefits of wt loss Follow-up annual 3 months labs      Relevant Medications   Semaglutide (OZEMPIC) 0.25 or 0.5 MG/DOSE SOPN   metFORMIN (GLUCOPHAGE) 1000 MG tablet   Type 2 diabetes mellitus without complications (HCC) - Primary    Sigificantly elevated A1c 8.0, from prior 6.2, despite improved diet/lifestyle wt loss Suspect secondary to self discontinued meds - stopped Jardiance, prior DC'd Actos No known complications or hypoglycemia. Failed SGL2 (jardiance - UTI)  Plan:  1. Discussion on adding new med instead of Jardiance - discussed GLP1 again - agreed on new start Ozempic GLP1 0.25mg  weekly [place] inj x 4 weeks then inc to 0.5mg  weekly inj - rx sent and copay card given, counseling on benefits including wt loss and risks side effects - STOP Janumet - due to ineffect DPP4  with GLP1 - NEW rx Metformin 1000mg  BID to replace Janumet Encouraged continue work on implementing healthy DM diet, limited portion, reviewed reduced carb plan, continue improve hydration, start regular exercise, treadmill Continue ARB, statin Recommended scheduling annual DM eye exam, plans to see Dr Clydene Pugh locally Follow-up q 3 months for annual + A1c - may need dose titrate up to 1mg  weekly      Relevant Medications   Semaglutide (OZEMPIC) 0.25 or 0.5 MG/DOSE SOPN   Insulin Pen Needle (INSUPEN PEN NEEDLES) 32G X 4 MM MISC   metFORMIN (GLUCOPHAGE) 1000 MG tablet   Other Relevant Orders   POCT HgB A1C (Completed)    Other Visit Diagnoses    SK (seborrheic keratosis)       Relevant Orders   Ambulatory referral to Dermatology   Skin tag       Relevant Orders   Ambulatory referral to Dermatology  See referral  note, benign appearance of both skin tag and SK, patient preference referral for removal and general skin evaluation in future      Meds ordered this encounter  Medications  . Semaglutide (OZEMPIC) 0.25 or 0.5 MG/DOSE SOPN    Sig: Inject 0.5 mg into the skin once a week. For first 4 weeks, inject dose 0.25mg  weekly    Dispense:  1 pen    Refill:  2  . Insulin Pen Needle (INSUPEN PEN NEEDLES) 32G X 4 MM MISC    Sig: Use with Ozempic pen inject weekly    Dispense:  30 each    Refill:  3  . metFORMIN (GLUCOPHAGE) 1000 MG tablet    Sig: Take 1 tablet (1,000 mg total) by mouth 2 (two) times daily with a meal.    Dispense:  180 tablet    Refill:  3    Stopped Janumet   Orders Placed This Encounter  Procedures  . Ambulatory referral to Dermatology    Referral Priority:   Routine    Referral Type:   Consultation    Referral Reason:   Specialty Services Required    Referred to Provider:   Deirdre Evener, MD    Requested Specialty:   Dermatology    Number of Visits Requested:   1  . POCT HgB A1C      Follow up plan: Return in about 3 months (around 10/23/2017)  for Annual Physical.  Future labs ordered for 09/2017  Saralyn Pilar, DO [facility] 07/26/2017, 7:15 PM

## 2017-07-26 NOTE — Assessment & Plan Note (Signed)
Still improved wt loss >20lbs in 6 months, improved diet Goal to improve regular exercise several days weekly Continue >1 lb wt loss per week Start GLP1 with benefits of wt loss Follow-up annual 3 months labs

## 2017-09-01 ENCOUNTER — Other Ambulatory Visit: Payer: Self-pay | Admitting: Family Medicine

## 2017-09-01 ENCOUNTER — Telehealth: Payer: Self-pay | Admitting: Family Medicine

## 2017-09-01 DIAGNOSIS — E785 Hyperlipidemia, unspecified: Principal | ICD-10-CM

## 2017-09-01 DIAGNOSIS — E1169 Type 2 diabetes mellitus with other specified complication: Secondary | ICD-10-CM

## 2017-09-01 DIAGNOSIS — I10 Essential (primary) hypertension: Secondary | ICD-10-CM

## 2017-09-01 MED ORDER — PRAVASTATIN SODIUM 20 MG PO TABS: 20.0000 mg | ORAL_TABLET | Freq: Every day | ORAL | 0 refills | Status: DC

## 2017-09-01 MED ORDER — PRAVASTATIN SODIUM 20 MG PO TABS: 20.0000 mg | ORAL_TABLET | Freq: Every day | ORAL | 3 refills | Status: DC

## 2017-09-01 MED ORDER — LOSARTAN POTASSIUM 50 MG PO TABS: 50.0000 mg | ORAL_TABLET | Freq: Every day | ORAL | 0 refills | Status: DC

## 2017-09-01 MED ORDER — LOSARTAN POTASSIUM 50 MG PO TABS: 50.0000 mg | ORAL_TABLET | Freq: Every day | ORAL | 3 refills | Status: DC

## 2017-09-01 NOTE — Telephone Encounter (Signed)
Pt's changed to [facility] in [place].  Please resend prescriptons.

## 2017-10-05 ENCOUNTER — Other Ambulatory Visit: Payer: Self-pay | Admitting: Family Medicine

## 2017-10-05 DIAGNOSIS — F419 Anxiety disorder, unspecified: Secondary | ICD-10-CM

## 2017-10-16 ENCOUNTER — Other Ambulatory Visit: Payer: PRIVATE HEALTH INSURANCE

## 2017-10-17 LAB — CBC WITH DIFFERENTIAL/PLATELET
BASOS ABS: 52 {cells}/uL (ref 0–200)
Basophils Relative: 0.8 %
EOS ABS: 137 {cells}/uL (ref 15–500)
Eosinophils Relative: 2.1 %
HCT: 45.8 % (ref 38.5–50.0)
Hemoglobin: 15.5 g/dL (ref 13.2–17.1)
Lymphs Abs: 1677 cells/uL (ref 850–3900)
MCH: 30.2 pg (ref 27.0–33.0)
MCHC: 33.8 g/dL (ref 32.0–36.0)
MCV: 89.1 fL (ref 80.0–100.0)
MONOS PCT: 9 %
MPV: 10.3 fL (ref 7.5–12.5)
NEUTROS PCT: 62.3 %
Neutro Abs: 4050 cells/uL (ref 1500–7800)
PLATELETS: 308 10*3/uL (ref 140–400)
RBC: 5.14 10*6/uL (ref 4.20–5.80)
RDW: 12.8 % (ref 11.0–15.0)
TOTAL LYMPHOCYTE: 25.8 %
WBC mixed population: 585 cells/uL (ref 200–950)
WBC: 6.5 10*3/uL (ref 3.8–10.8)

## 2017-10-17 LAB — LIPID PANEL
CHOLESTEROL: 150 mg/dL (ref <200)
HDL: 46 mg/dL (ref >40)
LDL Cholesterol (Calc): 75 mg/dL (calc)
Non-HDL Cholesterol (Calc): 104 mg/dL (calc) (ref <130)
Total CHOL/HDL Ratio: 3.3 (calc) (ref <5.0)
Triglycerides: 203 mg/dL — ABNORMAL HIGH (ref <150)

## 2017-10-17 LAB — COMPLETE METABOLIC PANEL WITH GFR
AG Ratio: 1.7 (calc) (ref 1.0–2.5)
ALBUMIN MSPROF: 4.5 g/dL (ref 3.6–5.1)
ALT: 34 U/L (ref 9–46)
AST: 15 U/L (ref 10–40)
Alkaline phosphatase (APISO): 53 U/L (ref 40–115)
BUN: 18 mg/dL (ref 7–25)
CALCIUM: 9.5 mg/dL (ref 8.6–10.3)
CO2: 27 mmol/L (ref 20–32)
CREATININE: 0.87 mg/dL (ref 0.60–1.35)
Chloride: 103 mmol/L (ref 98–110)
GFR, EST NON AFRICAN AMERICAN: 105 mL/min/{1.73_m2} (ref >=60)
GFR, Est African American: 122 mL/min/{1.73_m2} (ref >=60)
GLOBULIN: 2.6 g/dL (ref 1.9–3.7)
Glucose, Bld: 156 mg/dL — ABNORMAL HIGH (ref 65–99)
Potassium: 4.5 mmol/L (ref 3.5–5.3)
Sodium: 139 mmol/L (ref 135–146)
Total Bilirubin: 0.5 mg/dL (ref 0.2–1.2)
Total Protein: 7.1 g/dL (ref 6.1–8.1)

## 2017-10-17 LAB — VITAMIN D 25 HYDROXY (VIT D DEFICIENCY, FRACTURES): Vit D, 25-Hydroxy: 21 ng/mL — ABNORMAL LOW (ref 30–100)

## 2017-10-17 LAB — TSH: TSH: 1.48 mIU/L (ref 0.40–4.50)

## 2017-10-17 LAB — HIV ANTIBODY (ROUTINE TESTING W REFLEX): HIV 1&2 Ab, 4th Generation: NONREACTIVE

## 2017-10-17 LAB — HEMOGLOBIN A1C
Hgb A1c MFr Bld: 7.2 % of total Hgb — ABNORMAL HIGH (ref <5.7)
Mean Plasma Glucose: 160 (calc)
eAG (mmol/L): 8.9 (calc)

## 2017-10-18 ENCOUNTER — Other Ambulatory Visit: Payer: Self-pay | Admitting: Family Medicine

## 2017-10-18 DIAGNOSIS — E119 Type 2 diabetes mellitus without complications: Secondary | ICD-10-CM

## 2017-10-18 MED ORDER — SEMAGLUTIDE (1 MG/DOSE) 2 MG/1.5ML ~~LOC~~ SOPN: 1.0000 mg | PEN_INJECTOR | SUBCUTANEOUS | 5 refills | Status: DC

## 2017-10-18 NOTE — Telephone Encounter (Signed)
Received refill request for Ozempic 0.5mg  pen dosing - called patient his next apt is next week 5/28 for annual and lab review, A1c improved from 8 to 7.2 with addition of ozempic and stopped janumet, switch to metformin. His next dose is Saturday this week.  We agreed that we will increase dose titrate up Ozempic to top dose of  weekly per injection, and hope to reduce metformin in future.  Declined refill Ozempic 0.5mg   New rx sent to pharmacy Ozempic  per dose weekly - #2 pens or 3mL (1.13mL per pen) - with +5 refills to [facility] pharmacy.  1st dose of new pen is Saturday.  Recent Labs    12/27/16 07/26/17 0837 10/16/17 0753  HGBA1C 6.2 8.0* 7.2*   Saralyn Pilar, DO [facility] 10/18/2017, 12:24 PM

## 2017-10-19 ENCOUNTER — Encounter: Payer: PRIVATE HEALTH INSURANCE | Admitting: Family Medicine

## 2017-10-20 ENCOUNTER — Encounter: Payer: Self-pay | Admitting: Family Medicine

## 2017-10-24 ENCOUNTER — Ambulatory Visit (INDEPENDENT_AMBULATORY_CARE_PROVIDER_SITE_OTHER): Payer: PRIVATE HEALTH INSURANCE | Admitting: Family Medicine

## 2017-10-24 ENCOUNTER — Encounter: Payer: Self-pay | Admitting: Family Medicine

## 2017-10-24 VITALS — BP 125/77 | HR 80 | Temp 98.6°F | Resp 16 | Ht 72.0 in | Wt 313.0 lb

## 2017-10-24 DIAGNOSIS — Z0000 Encounter for general adult medical examination without abnormal findings: Secondary | ICD-10-CM | POA: Diagnosis not present

## 2017-10-24 DIAGNOSIS — E1169 Type 2 diabetes mellitus with other specified complication: Secondary | ICD-10-CM | POA: Diagnosis not present

## 2017-10-24 DIAGNOSIS — E559 Vitamin D deficiency, unspecified: Secondary | ICD-10-CM

## 2017-10-24 DIAGNOSIS — I10 Essential (primary) hypertension: Secondary | ICD-10-CM | POA: Diagnosis not present

## 2017-10-24 DIAGNOSIS — Z6841 Body Mass Index (BMI) 40.0 and over, adult: Secondary | ICD-10-CM | POA: Diagnosis not present

## 2017-10-24 DIAGNOSIS — E785 Hyperlipidemia, unspecified: Secondary | ICD-10-CM

## 2017-10-24 NOTE — Patient Instructions (Addendum)
Thank you for coming to the office today.  Continue Ozempic  weekly injection For now continue Metformin as you are Re-check 3 months  Low Vitamin D again 21 - last time 25 - uncertain if it improved before - try one more time with supplement - Start OTC Vitamin D3 5,000 iu daily for 12 weeks then reduce to OTC Vitamin D3 2,000 iu daily for maintenance  Please schedule your Annual Diabetic Eye Exam - Dr Clydene Pugh - have them fax [place] a paper copy of the report  [facility]   Address: [address], [place] [address] Phone: [phone]  Website: [email or web address]   Please schedule a Follow-up Appointment to: Return in about 3 months (around 01/24/2018) for DM A1c, Weight .  If you have any other questions or concerns, please feel free to call the office or send a message through MyChart. You may also schedule an earlier appointment if necessary.  Additionally, you may be receiving a survey about your experience at our office within a few days to 1 week by e-mail or mail. We value your feedback.  Saralyn Pilar, DO [facility], [place]

## 2017-10-24 NOTE — Progress Notes (Signed)
Subjective:    Patient ID: [place], male    DOB: November 11, 1975, 42 y.o.   MRN: [id number]  [place] is a 42 y.o. male presenting on 10/24/2017 for Annual Exam   HPI  Here for Annual Physical / Lab Review.  CHRONIC DM, Type 2 / MORBID OBESITY BMI 42 Reports no new concerns, CBGs: Avg 120s-130s, Low 80s-90 (more rare mostly asymptomatic). Checks CBGs daily, usually not fasting, 1-2 hr postprandial Meds: Ozempic '1mg'$  weekly [place] inj (Saturday), Metformin '1000mg'$  BID - Remains off Jardiance, Janumet, Actos - prior medications Reports good compliance. Tolerating well w/o side-effects Currently on ARB (Losartan) Lifestyle: Weight down 14 lbs in 3 months, on Ozempic and improved lifestyle, reduced portion size diet changes DM diet, and gradually increasing exercise. His goal is now to get weight < 200 - Diet (improved DM diet, healthier choices) - Exercise (gradual increasing exercise, on treadmill and walking) - has not had DM eye exam recently, Dr Ellin Mayhew - still has not scheduled yet - Denies hypoglycemia  HYPERLIPIDEMIA: - Reports no concerns. Last lipid panel 09/2017, significantly improved TG otherwise normal LDL and HDL controlled on statin - Currently taking Pravastatin '20mg'$  daily, tolerating well without side effects or myalgias  Vitamin D Deficiency: - Last lab result still low at 21, previously 23 and he took supplement in past, did not continue daily maintenance   Chronic Anxiety and Intermittent Stress - Prior history of anxiety >15-20 years ago, treated with Paxil in past,  - Currently taking Fluoxetine '20mg'$  daily, had been on from his prior PCP for several years ago, with good results  Health Maintenance: - UTD Vaccines, including pneumonia vaccine, TDAp - Not indicated for early prostate or colon cancer screening   Depression screen [facility] 2/9 10/24/2017 10/26/2016 02/29/2016  Decreased Interest 0 0 0  Down, Depressed, Hopeless 0 0 0  PHQ - 2 Score 0 0 0    GAD 7 : Generalized Anxiety Score 10/26/2016 02/29/2016  Nervous, Anxious, on Edge 0 0  Control/stop worrying 0 0  Worry too much - different things 1 0  Trouble relaxing 1 0  Restless 0 0  Easily annoyed or irritable 0 0  Afraid - awful might happen 0 0  Total GAD 7 Score 2 0  Anxiety Difficulty Not difficult at all Not difficult at all     Past Medical History:  Diagnosis Date  . Diabetes mellitus without complication ([place])   . Hypertension   . Sleep apnea    Past Surgical History:  Procedure Laterality Date  . CYSTECTOMY  3846,6599   head   . NASAL SEPTUM SURGERY  2005  . skin lesion exicision  2012   scalp Dr.[facility]   Social History   Socioeconomic History  . Marital status: Married    Spouse name: Not on file  . Number of children: Not on file  . Years of education: Not on file  . Highest education level: Not on file  Occupational History  . Occupation: Engineer, structural  Social Needs  . Financial resource strain: Not on file  . Food insecurity:    Worry: Not on file    Inability: Not on file  . Transportation needs:    Medical: Not on file    Non-medical: Not on file  Tobacco Use  . Smoking status: Never Smoker  . Smokeless tobacco: Never Used  Substance and Sexual Activity  . Alcohol use: Yes    Alcohol/week: 0.0 oz  Comment: occasional  . Drug use: No  . Sexual activity: Not on file  Lifestyle  . Physical activity:    Days per week: Not on file    Minutes per session: Not on file  . Stress: Not on file  Relationships  . Social connections:    Talks on phone: Not on file    Gets together: Not on file    Attends religious service: Not on file    Active member of club or organization: Not on file    Attends meetings of clubs or organizations: Not on file    Relationship status: Not on file  . Intimate partner violence:    Fear of current or ex partner: Not on file    Emotionally abused: Not on file    Physically abused: Not on file     Forced sexual activity: Not on file  Other Topics Concern  . Not on file  Social History Narrative  . Not on file   Family History  Problem Relation Age of Onset  . Cancer Father        lung  . Heart disease Father   . Hypertension Father   . Diabetes Father   . Hypertension Mother   . Diabetes Mother   . Prostate cancer Neg Hx   . Colon cancer Neg Hx    Current Outpatient Medications on File Prior to Visit  Medication Sig  . Blood Glucose Monitoring Suppl (ONE TOUCH ULTRA SYSTEM KIT) w/Device KIT 1 kit by Does not apply route once.  . Cholecalciferol (VITAMIN D-3 PO) Take 1,000 Int'l Units by mouth daily.  Marland Kitchen FLUoxetine (PROZAC) 20 MG capsule TAKE ONE CAPSULE BY MOUTH ONCE DAILY.  . fluticasone (FLONASE) 50 MCG/ACT nasal spray Place 2 sprays into both nostrils daily. Use for 4-6 weeks then stop and use seasonally or as needed.  Marland Kitchen glucose blood test strip 1 each by Other route as needed for other. Use as instructed  . Insulin Pen Needle (INSUPEN PEN NEEDLES) 32G X 4 MM MISC Use with Ozempic pen inject weekly  . losartan (COZAAR) 50 MG tablet Take 1 tablet (50 mg total) by mouth daily.  . metFORMIN (GLUCOPHAGE) 1000 MG tablet Take 1 tablet (1,000 mg total) by mouth 2 (two) times daily with a meal.  . pravastatin (PRAVACHOL) 20 MG tablet Take 1 tablet (20 mg total) by mouth daily.  . Semaglutide (OZEMPIC) 1 MG/DOSE SOPN Inject 1 mg into the skin once a week.   No current facility-administered medications on file prior to visit.     Review of Systems  Constitutional: Negative for activity change, appetite change, chills, diaphoresis, fatigue and fever.  HENT: Negative for congestion and hearing loss.   Eyes: Negative for visual disturbance.  Respiratory: Negative for apnea, cough, chest tightness, shortness of breath and wheezing.   Cardiovascular: Negative for chest pain, palpitations and leg swelling.  Gastrointestinal: Negative for abdominal pain, anal bleeding, blood in stool,  constipation, diarrhea, nausea and vomiting.  Endocrine: Negative for cold intolerance.  Genitourinary: Negative for decreased urine volume, difficulty urinating, dysuria, frequency, hematuria, testicular pain and urgency.  Musculoskeletal: Negative for arthralgias, back pain and neck pain.  Skin: Negative for rash.  Allergic/Immunologic: Negative for environmental allergies.  Neurological: Negative for dizziness, weakness, light-headedness, numbness and headaches.  Hematological: Negative for adenopathy.  Psychiatric/Behavioral: Negative for behavioral problems, dysphoric mood and sleep disturbance. The patient is not nervous/anxious.    Per HPI unless specifically indicated above  Objective:    BP 125/77   Pulse 80   Temp 98.6 F (37 C) (Oral)   Resp 16   Ht 6' (1.829 m)   Wt (!) 313 lb (142 kg)   BMI 42.45 kg/m   Wt Readings from Last 3 Encounters:  10/24/17 (!) 313 lb (142 kg)  07/26/17 (!) 327 lb (148.3 kg)  12/28/16 (!) 340 lb (154.2 kg)    Physical Exam  Constitutional: He is oriented to person, place, and time. He appears well-developed and well-nourished. No distress.  Well-appearing, comfortable, cooperative, significant weight loss, still obese  HENT:  Head: Normocephalic and atraumatic.  Mouth/Throat: Oropharynx is clear and moist.  Frontal / maxillary sinuses non-tender. Nares patent without purulence or edema. Bilateral TMs clear without erythema, effusion or bulging. Oropharynx clear without erythema, exudates, edema or asymmetry.  Eyes: Pupils are equal, round, and reactive to light. Conjunctivae and EOM are normal. Right eye exhibits no discharge. Left eye exhibits no discharge.  Neck: Normal range of motion. Neck supple. No thyromegaly present.  No carotid bruits  Cardiovascular: Normal rate, regular rhythm, normal heart sounds and intact distal pulses.  No murmur heard. Pulmonary/Chest: Effort normal and breath sounds normal. No respiratory distress.  He has no wheezes. He has no rales.  Abdominal: Soft. Bowel sounds are normal. He exhibits no distension and no mass. There is no tenderness.  Musculoskeletal: Normal range of motion. He exhibits no edema or tenderness.  Upper / Lower Extremities: - Normal muscle tone, strength bilateral upper extremities 5/5, lower extremities 5/5  Lymphadenopathy:    He has no cervical adenopathy.  Neurological: He is alert and oriented to person, place, and time.  Distal sensation intact to light touch all extremities  Skin: Skin is warm and dry. No rash noted. He is not diaphoretic. No erythema.  Psychiatric: He has a normal mood and affect. His behavior is normal.  Well groomed, good eye contact, normal speech and thoughts  Nursing note and vitals reviewed.      Diabetic Foot Exam - Simple   Simple Foot Form Diabetic Foot exam was performed with the following findings:  Yes 10/24/2017  9:06 AM  Visual Inspection See comments:  Yes Sensation Testing Intact to touch and monofilament testing bilaterally:  Yes Pulse Check Posterior Tibialis and Dorsalis pulse intact bilaterally:  Yes Comments Generalized mild dry flaking skin, some early callus formation lateral forefoot bilateral.     Results for orders placed or performed in visit on 10/16/17  TSH  Result Value Ref Range   TSH 1.48 0.40 - 4.50 mIU/L  VITAMIN D 25 Hydroxy (Vit-D Deficiency, Fractures)  Result Value Ref Range   Vit D, 25-Hydroxy 21 (L) 30 - 100 ng/mL  HIV antibody  Result Value Ref Range   HIV 1&2 Ab, 4th Generation NON-REACTIVE NON-REACTI  CBC with Differential/Platelet  Result Value Ref Range   WBC 6.5 3.8 - 10.8 Thousand/uL   RBC 5.14 4.20 - 5.80 Million/uL   Hemoglobin 15.5 13.2 - 17.1 g/dL   HCT 45.8 38.5 - 50.0 %   MCV 89.1 80.0 - 100.0 fL   MCH 30.2 27.0 - 33.0 pg   MCHC 33.8 32.0 - 36.0 g/dL   RDW 12.8 11.0 - 15.0 %   Platelets 308 140 - 400 Thousand/uL   MPV 10.3 7.5 - 12.5 fL   Neutro Abs 4,050 1,500 -  7,800 cells/uL   Lymphs Abs 1,677 850 - 3,900 cells/uL   WBC mixed population 585 200 -  950 cells/uL   Eosinophils Absolute 137 15 - 500 cells/uL   Basophils Absolute 52 0 - 200 cells/uL   Neutrophils Relative % 62.3 %   Total Lymphocyte 25.8 %   Monocytes Relative 9.0 %   Eosinophils Relative 2.1 %   Basophils Relative 0.8 %  Hemoglobin A1c  Result Value Ref Range   Hgb A1c MFr Bld 7.2 (H) <5.7 % of total Hgb   Mean Plasma Glucose 160 (calc)   eAG (mmol/L) 8.9 (calc)  Lipid panel  Result Value Ref Range   Cholesterol 150 <200 mg/dL   HDL 46 >40 mg/dL   Triglycerides 203 (H) <150 mg/dL   LDL Cholesterol (Calc) 75 mg/dL (calc)   Total CHOL/HDL Ratio 3.3 <5.0 (calc)   Non-HDL Cholesterol (Calc) 104 <130 mg/dL (calc)  COMPLETE METABOLIC PANEL WITH GFR  Result Value Ref Range   Glucose, Bld 156 (H) 65 - 99 mg/dL   BUN 18 7 - 25 mg/dL   Creat 0.87 0.60 - 1.35 mg/dL   GFR, Est Non African American 105 > OR = 60 mL/min/1.54m   GFR, Est African American 122 > OR = 60 mL/min/1.727m  BUN/Creatinine Ratio NOT APPLICABLE 6 - 22 (calc)   Sodium 139 135 - 146 mmol/L   Potassium 4.5 3.5 - 5.3 mmol/L   Chloride 103 98 - 110 mmol/L   CO2 27 20 - 32 mmol/L   Calcium 9.5 8.6 - 10.3 mg/dL   Total Protein 7.1 6.1 - 8.1 g/dL   Albumin 4.5 3.6 - 5.1 g/dL   Globulin 2.6 1.9 - 3.7 g/dL (calc)   AG Ratio 1.7 1.0 - 2.5 (calc)   Total Bilirubin 0.5 0.2 - 1.2 mg/dL   Alkaline phosphatase (APISO) 53 40 - 115 U/L   AST 15 10 - 40 U/L   ALT 34 9 - 46 U/L   Recent Labs    12/27/16 07/26/17 0837 10/16/17 0753  HGBA1C 6.2 8.0* 7.2*      Assessment & Plan:   Problem List Items Addressed This Visit    Essential hypertension   Hyperlipidemia associated with type 2 diabetes mellitus ([place]   Mostly controlled cholesterol on pravastatin - with lifestyle improvements Last lipid panel 09/2017  Plan: 1. Continue current meds - Ozempic '1mg'$  [place] weekly 2. Encourage improved lifestyle - low  carb/cholesterol, reduce portion size, continue improving regular exercise 3. Follow-up yearly lipids      Morbid obesity with BMI of 40.0-44.9, adult (HCC)    Still improved wt loss >14 lbs in 3 months, improved diet Goal to improve regular exercise several days weekly Continue >1 lb wt loss per week Increase GLP1 with benefits of wt loss Follow-up 3 months labs      Type 2 diabetes mellitus with other specified complication (HCC)    Sigificantly elevated A1c down to 7.2 from prior 8.0 on Ozempic GLP1 Stopped Jardiance, prior DC'd Actos Complicated by HTN. HLD. Morbid obesity. Failed SGL2 (jardiance - UTI)  Plan:  1. Continue current dose Ozempic '1mg'$  weekly [place] injection - already provided counseling on benefits including wt loss and risks side effects with dose adjust last week when called and he was able to pick up this weekend - Continue Metformin '1000mg'$  BID for now future may reduce dose or DC Encouraged continue work on implementing healthy DM diet, limited portion, reviewed reduced carb plan, continue improve hydration, continue regular exercise, treadmill Continue ARB, statin Recommended scheduling annual DM eye exam, plans to see Dr WoEllin Mayhew  locally Follow-up q 3 months      Vitamin D deficiency    Still low Vit D Continue Vitamin D3 5,000 daily for now 8-12 weeks then daily supplement OTC Vitamin D3 2,000 daily       Other Visit Diagnoses    Annual physical exam    -  Primary     Updated health maintenance Review lab results Encourage improving lifestyle and diet recommendations  No orders of the defined types were placed in this encounter.   Follow up plan: Return in about 3 months (around 01/24/2018) for DM A1c, Weight .  Nobie Putnam, [facility] 10/25/2017, 1:06 AM

## 2017-10-25 NOTE — Assessment & Plan Note (Addendum)
Sigificantly elevated A1c down to 7.2 from prior 8.0 on Ozempic GLP1 Stopped Jardiance, prior DC'd Actos Complicated by HTN. HLD. Morbid obesity. Failed SGL2 (jardiance - UTI)  Plan:  1. Continue current dose Ozempic  weekly Colton Long injection - already provided counseling on benefits including wt loss and risks side effects with dose adjust last week when called and he was able to pick up this weekend - Continue Metformin  BID for now future may reduce dose or DC Encouraged continue work on implementing healthy DM diet, limited portion, reviewed reduced carb plan, continue improve hydration, continue regular exercise, treadmill Continue ARB, statin Recommended scheduling annual DM eye exam, plans to see Dr Clydene Pugh locally Follow-up q 3 months

## 2017-10-25 NOTE — Assessment & Plan Note (Signed)
Still improved wt loss >14 lbs in 3 months, improved diet Goal to improve regular exercise several days weekly Continue >1 lb wt loss per week Increase GLP1 with benefits of wt loss Follow-up 3 months labs

## 2017-10-25 NOTE — Assessment & Plan Note (Addendum)
Still low Vit D Continue Vitamin D3 5,000 daily for now 8-12 weeks then daily supplement OTC Vitamin D3 2,000 daily

## 2017-10-25 NOTE — Assessment & Plan Note (Signed)
Mostly controlled cholesterol on pravastatin - with lifestyle improvements Last lipid panel 09/2017  Plan: 1. Continue current meds - Ozempic  [place] weekly 2. Encourage improved lifestyle - low carb/cholesterol, reduce portion size, continue improving regular exercise 3. Follow-up yearly lipids

## 2018-01-25 ENCOUNTER — Ambulatory Visit: Payer: PRIVATE HEALTH INSURANCE | Admitting: Family Medicine

## 2018-02-15 ENCOUNTER — Ambulatory Visit (INDEPENDENT_AMBULATORY_CARE_PROVIDER_SITE_OTHER): Payer: PRIVATE HEALTH INSURANCE | Admitting: Family Medicine

## 2018-02-15 ENCOUNTER — Other Ambulatory Visit: Payer: Self-pay | Admitting: Family Medicine

## 2018-02-15 ENCOUNTER — Encounter: Payer: Self-pay | Admitting: Family Medicine

## 2018-02-15 VITALS — BP 104/76 | HR 80 | Temp 98.0°F | Resp 16 | Ht 72.0 in | Wt 304.0 lb

## 2018-02-15 DIAGNOSIS — Z6841 Body Mass Index (BMI) 40.0 and over, adult: Secondary | ICD-10-CM

## 2018-02-15 DIAGNOSIS — M545 Low back pain: Secondary | ICD-10-CM | POA: Diagnosis not present

## 2018-02-15 DIAGNOSIS — Z23 Encounter for immunization: Secondary | ICD-10-CM | POA: Diagnosis not present

## 2018-02-15 DIAGNOSIS — E559 Vitamin D deficiency, unspecified: Secondary | ICD-10-CM

## 2018-02-15 DIAGNOSIS — F419 Anxiety disorder, unspecified: Secondary | ICD-10-CM

## 2018-02-15 DIAGNOSIS — E1169 Type 2 diabetes mellitus with other specified complication: Secondary | ICD-10-CM

## 2018-02-15 DIAGNOSIS — E785 Hyperlipidemia, unspecified: Secondary | ICD-10-CM

## 2018-02-15 DIAGNOSIS — Z0000 Encounter for general adult medical examination without abnormal findings: Secondary | ICD-10-CM

## 2018-02-15 DIAGNOSIS — I10 Essential (primary) hypertension: Secondary | ICD-10-CM

## 2018-02-15 LAB — POCT URINALYSIS DIPSTICK
Bilirubin, UA: NEGATIVE
Blood, UA: NEGATIVE
Glucose, UA: NEGATIVE
Ketones, UA: NEGATIVE
LEUKOCYTES UA: NEGATIVE
NITRITE UA: NEGATIVE
PH UA: 5 (ref 5.0–8.0)
PROTEIN UA: NEGATIVE
Spec Grav, UA: 1.02 (ref 1.010–1.025)
Urobilinogen, UA: 0.2 E.U./dL

## 2018-02-15 LAB — POCT GLYCOSYLATED HEMOGLOBIN (HGB A1C): Hemoglobin A1C: 6.3 % — AB (ref 4.0–5.6)

## 2018-02-15 MED ORDER — BACLOFEN 10 MG PO TABS: 5.0000 mg | ORAL_TABLET | Freq: Three times a day (TID) | ORAL | 0 refills | Status: DC | PRN

## 2018-02-15 MED ORDER — SEMAGLUTIDE (1 MG/DOSE) 2 MG/1.5ML ~~LOC~~ SOPN: 1.0000 mg | PEN_INJECTOR | SUBCUTANEOUS | 8 refills | Status: DC

## 2018-02-15 NOTE — Progress Notes (Signed)
Subjective:    Patient ID: Colton Long, male    DOB: Dec 02, 1975, 42 y.o.   MRN: [id number]  Colton Long is a 42 y.o. male presenting on 02/15/2018 for Diabetes and Nephrolithiasis (as per patient feels like kidney stone, nausea, burning while using bathroom and has dull pain not sharp, right side hurts onset couple of weeks)   HPI  CHRONIC DM, Type 2 / MORBID OBESITY BMI 42 Follow-up from last visit 09/2017, he was started on Ozempic titration new GLP1 back in 06/2017 then titration increase to 1mg  weekly. - Today reports doing very well. - No side effects, no stomach upset, even on metformin now CBG - Avg sugars 110-120s, Low >100 has very rarely early symptoms of dizzy but not hypoglycemia, < 150 Meds: Ozempic 1mg  weekly [place] inj (Saturday), Metformin 1000mg  BID Reports good compliance. Tolerating well w/o side-effects Currently on ARB (Losartan) Lifestyle: Weight down further, overall 30-40 lbs in 7 months, gradual loss on DM diet, and gradually increasing exercise. His goal is now to get weight < 200 - Improving carb control DM diet, following a similar ketogenic diet - exercise building slowly, treadmill walking - has not had DM eye exam recently, DrWoodard - still has not scheduled yet, he admits will do this now - Denies hypoglycemia  R low back pain vs possible Nephrolithiasis vs UTI Reports few years ago he had prior history of kidney infection (more severe episode at that time had chills sweate etc and treated in the past) - R sided low back dull pain, feels like some pain "moves" or shifts, and had episode of nausea after pain, does not seem like a severe sharp pain, but rather more constant dull throbbing - He has not had a kidney stone that he was aware of. - Unsure if he had a muscle sprain Admits urinary stream maybe not as strong, not a burning but some slight discomfort at times. - Denies any fever, chills, sweats, dysuria, hematuria, nausea vomiting  Health  Maintenance:  Reports already had Pneumonia vaccine from [facility], likely pneumovax-23 before age 60. Will request record  Due for Flu Shot, will receive today    Depression screen [facility] 2/9 02/15/2018 10/24/2017 10/26/2016  Decreased Interest 0 0 0  Down, Depressed, Hopeless 0 0 0  PHQ - 2 Score 0 0 0    Social History   Tobacco Use  . Smoking status: Never Smoker  . Smokeless tobacco: Never Used  Substance Use Topics  . Alcohol use: Yes    Alcohol/week: 0.0 standard drinks    Comment: occasional  . Drug use: No    Review of Systems Per HPI unless specifically indicated above     Objective:    BP 104/76   Pulse 80   Temp 98 F (36.7 C) (Oral)   Resp 16   Ht 6' (1.829 m)   Wt (!) 304 lb (137.9 kg)   BMI 41.23 kg/m   Wt Readings from Last 3 Encounters:  02/15/18 (!) 304 lb (137.9 kg)  10/24/17 (!) 313 lb (142 kg)  07/26/17 (!) 327 lb (148.3 kg)    Physical Exam  Constitutional: He is oriented to person, place, and time. He appears well-developed and well-nourished. No distress.  Well-appearing, comfortable, cooperative, obese with improved wt loss  HENT:  Head: Normocephalic and atraumatic.  Mouth/Throat: Oropharynx is clear and moist.  Eyes: Conjunctivae are normal. Right eye exhibits no discharge. Left eye exhibits no discharge.  Cardiovascular: Normal rate, regular  rhythm, normal heart sounds and intact distal pulses.  No murmur heard. Pulmonary/Chest: Effort normal and breath sounds normal.  Abdominal: Soft. Bowel sounds are normal. He exhibits no distension. There is no tenderness.  Musculoskeletal: He exhibits no edema.  Localized R lower back lumbar paraspinal and more lateral pain, not reproduced on palpation but some muscle hypertonicity. Provoked with rotation and position change seated to standing. No CVAT. No radicular symptoms.  Neurological: He is alert and oriented to person, place, and time.  Skin: Skin is warm and dry. No rash noted. He is not  diaphoretic. No erythema.  Psychiatric: He has a normal mood and affect. His behavior is normal.  Well groomed, good eye contact, normal speech and thoughts  Nursing note and vitals reviewed.    Results for orders placed or performed in visit on 02/15/18  POCT Urinalysis Dipstick  Result Value Ref Range   Color, UA amber    Clarity, UA clear    Glucose, UA Negative Negative   Bilirubin, UA Negative    Ketones, UA Negative    Spec Grav, UA 1.020 1.010 - 1.025   Blood, UA Negative    pH, UA 5.0 5.0 - 8.0   Protein, UA Negative Negative   Urobilinogen, UA 0.2 0.2 or 1.0 E.U./dL   Nitrite, UA Negative    Leukocytes, UA Negative Negative   Appearance clear    Odor foul   POCT HgB A1C  Result Value Ref Range   Hemoglobin A1C 6.3 (A) 4.0 - 5.6 %   Recent Labs    07/26/17 0837 10/16/17 0753 02/15/18 1007  HGBA1C 8.0* 7.2* 6.3*       Assessment & Plan:   Problem List Items Addressed This Visit    Type 2 diabetes mellitus with other specified complication (HCC) - Primary    Significantly improved DM control, A1c down to 6.3, from recent trend 8 > 7.2 Overall improved on GLP1 top dose Ozempic and lifestyle improvements Complicated by HTN. HLD. Morbid obesity. Failed SGLT2 (jardiance - UTI), Actos  Plan:  1. Continue current dose Ozempic 1mg  weekly [place] injection - Continue Metformin 1000mg  BID for now future may reduce dose or DC - Encouraged continue work on implementing healthy DM diet, limited portion, reviewed reduced carb plan, continue improve hydration, continue regular exercise, treadmill Continue ARB, statin Again Recommended scheduling annual DM eye exam, plans to see Dr Clydene Pugh locally Follow-up q 8 months for annual and labs, since doing so well, will skip interval DM check now, he can return sooner if elevated sugars      Relevant Medications   Semaglutide, 1 MG/DOSE, (OZEMPIC, 1 MG/DOSE,) 2 MG/1.5ML SOPN   Other Relevant Orders   POCT HgB A1C (Completed)      Other Visit Diagnoses    Acute right-sided low back pain, with sciatica presence unspecified       Relevant Medications   baclofen (LIORESAL) 10 MG tablet   Other Relevant Orders   POCT Urinalysis Dipstick (Completed)   Needs flu shot       Relevant Orders   Flu Vaccine QUAD 36+ mos IM (Completed)      Clinically R-LBP localized seems more MSK related given exam and history provoking factors with positional movements only. Not consistent with nephrolithiasis, no prior history, UA is negative for blood. Also not consistent with UTI vs Pyelo based on UA and lack of urinary symptoms, afebrile.  Plan Defer urine culture at this time, will not send urine Counseling on possible  differential dx - most likely MSK - agree to trial Baclofen PRN muscle relaxant, caution sedation, likely take at night mostly and can consider during day if needed May use conservative measures, NSAID OTC, Tylenol, muscle rub, moist heat Offered KUB as back up plan - but we agreed to hold off, unlikely to yield diagnostic Follow-up course of symptoms, if worsening or new symptoms as advised can return or may go to hospital or UC if more acute symptoms  Meds ordered this encounter  Medications  . Semaglutide, 1 MG/DOSE, (OZEMPIC, 1 MG/DOSE,) 2 MG/1.5ML SOPN    Sig: Inject 1 mg into the skin once a week.    Dispense:  2 pen    Refill:  8    Add refills on file  . baclofen (LIORESAL) 10 MG tablet    Sig: Take 0.5-1 tablets (5-10 mg total) by mouth 3 (three) times daily as needed for muscle spasms.    Dispense:  30 each    Refill:  0    Follow up plan: Return in about 8 months (around 10/16/2018) for Annual Physical.  Future labs ordered for 10/10/18  Saralyn PilarAlexander Ethan Clayburn, DO [facility] [place] [facility] 02/15/2018, 6:40 PM

## 2018-02-15 NOTE — Assessment & Plan Note (Signed)
Significantly improved DM control, A1c down to 6.3, from recent trend 8 > 7.2 Overall improved on GLP1 top dose Ozempic and lifestyle improvements Complicated by HTN. HLD. Morbid obesity. Failed SGLT2 (jardiance - UTI), Actos  Plan:  1. Continue current dose Ozempic 1mg  weekly [place] injection - Continue Metformin 1000mg  BID for now future may reduce dose or DC - Encouraged continue work on implementing healthy DM diet, limited portion, reviewed reduced carb plan, continue improve hydration, continue regular exercise, treadmill Continue ARB, statin Again Recommended scheduling annual DM eye exam, plans to see Dr Clydene PughWoodard locally Follow-up q 8 months for annual and labs, since doing so well, will skip interval DM check now, he can return sooner if elevated sugars

## 2018-02-15 NOTE — Patient Instructions (Addendum)
Thank you for coming to the office today.  Flu Shot today  Refilled Ozempic, continue current dose, keep up the great work!  [facility] - if you cannot locate report, notify us the approximate date you received it.  Your provider would like to you have your annual eye exam. Please contact your current eye doctor or here are some good options for you to contact.   [facility]   Address: [address], [place], [place] [address] Phone: [phone]  Website: [email or web address]  Start taking Baclofen (Lioresal) 10mg  (muscle relaxant) - start with one pill at night as needed for next 1-3 nights (may make you drowsy, caution with driving) see how it affects you, then if tolerated increase to one pill 2 to 3 times a day or (every 8 hours as needed)  Recommend trial of Anti-inflammatory with ALeve / Naproxen 220 or 250mg  tabs - take one or two with food and plenty of water TWICE daily every day (breakfast and dinner), for next 2 to 4 weeks, then you may take only as needed - DO NOT TAKE any ibuprofen, aleve, motrin while you are taking this medicine - It is safe to take Tylenol Ext Str 500mg  tabs - take 1 to 2 (max dose 1000mg ) every 6 hours as needed for breakthrough pain, max 24 hour daily dose is 6 to 8 tablets or 4000mg   May use muscle rub or heat.  If worsening symptoms or new concern for kidney stone or infection notify us or go to hospital ED   Please schedule a Follow-up Appointment to: Return in about 8 months (around 10/16/2018) for Annual Physical.  If you have any other questions or concerns, please feel free to call the office or send a message through MyChart. You may also schedule an earlier appointment if necessary.  Additionally, you may be receiving a survey about your experience at our office within a few days to 1 week by e-mail or mail. We value your feedback.  Saralyn PilarAlexander Karamalegos, DO [facility] [place] [facility]

## 2018-03-29 LAB — HM DIABETES EYE EXAM

## 2018-04-05 ENCOUNTER — Encounter: Payer: Self-pay | Admitting: Family Medicine

## 2018-07-09 ENCOUNTER — Other Ambulatory Visit: Payer: Self-pay | Admitting: Family Medicine

## 2018-07-09 DIAGNOSIS — F419 Anxiety disorder, unspecified: Secondary | ICD-10-CM

## 2018-07-26 ENCOUNTER — Other Ambulatory Visit: Payer: Self-pay | Admitting: Family Medicine

## 2018-07-26 DIAGNOSIS — E119 Type 2 diabetes mellitus without complications: Secondary | ICD-10-CM

## 2018-09-08 ENCOUNTER — Other Ambulatory Visit: Payer: Self-pay | Admitting: Family Medicine

## 2018-09-08 DIAGNOSIS — I10 Essential (primary) hypertension: Secondary | ICD-10-CM

## 2018-10-10 ENCOUNTER — Other Ambulatory Visit: Payer: PRIVATE HEALTH INSURANCE

## 2018-10-17 ENCOUNTER — Encounter: Payer: PRIVATE HEALTH INSURANCE | Admitting: Family Medicine

## 2018-11-08 ENCOUNTER — Other Ambulatory Visit: Payer: Self-pay

## 2018-11-09 ENCOUNTER — Other Ambulatory Visit: Payer: Self-pay

## 2018-11-20 LAB — COMPLETE METABOLIC PANEL WITH GFR
AG Ratio: 1.7 (calc) (ref 1.0–2.5)
ALT: 33 U/L (ref 9–46)
AST: 19 U/L (ref 10–40)
Albumin: 3.9 g/dL (ref 3.6–5.1)
Alkaline phosphatase (APISO): 46 U/L (ref 36–130)
BUN: 11 mg/dL (ref 7–25)
CO2: 26 mmol/L (ref 20–32)
Calcium: 9 mg/dL (ref 8.6–10.3)
Chloride: 105 mmol/L (ref 98–110)
Creat: 0.83 mg/dL (ref 0.60–1.35)
GFR, Est African American: 123 mL/min/{1.73_m2} (ref >=60)
GFR, Est Non African American: 106 mL/min/{1.73_m2} (ref >=60)
Globulin: 2.3 g/dL (calc) (ref 1.9–3.7)
Glucose, Bld: 156 mg/dL — ABNORMAL HIGH (ref 65–99)
Potassium: 4.5 mmol/L (ref 3.5–5.3)
Sodium: 138 mmol/L (ref 135–146)
Total Bilirubin: 0.7 mg/dL (ref 0.2–1.2)
Total Protein: 6.2 g/dL (ref 6.1–8.1)

## 2018-11-20 LAB — CBC WITH DIFFERENTIAL/PLATELET
Absolute Monocytes: 470 cells/uL (ref 200–950)
Basophils Absolute: 42 cells/uL (ref 0–200)
Basophils Relative: 0.9 %
Eosinophils Absolute: 113 cells/uL (ref 15–500)
Eosinophils Relative: 2.4 %
HCT: 42.5 % (ref 38.5–50.0)
Hemoglobin: 14.2 g/dL (ref 13.2–17.1)
Lymphs Abs: 1518 cells/uL (ref 850–3900)
MCH: 30.1 pg (ref 27.0–33.0)
MCHC: 33.4 g/dL (ref 32.0–36.0)
MCV: 90.2 fL (ref 80.0–100.0)
MPV: 10.7 fL (ref 7.5–12.5)
Monocytes Relative: 10 %
Neutro Abs: 2557 cells/uL (ref 1500–7800)
Neutrophils Relative %: 54.4 %
Platelets: 293 10*3/uL (ref 140–400)
RBC: 4.71 10*6/uL (ref 4.20–5.80)
RDW: 12.8 % (ref 11.0–15.0)
Total Lymphocyte: 32.3 %
WBC: 4.7 10*3/uL (ref 3.8–10.8)

## 2018-11-20 LAB — LIPID PANEL
Cholesterol: 137 mg/dL (ref <200)
HDL: 46 mg/dL (ref >=40)
LDL Cholesterol (Calc): 73 mg/dL (calc)
Non-HDL Cholesterol (Calc): 91 mg/dL (calc) (ref <130)
Total CHOL/HDL Ratio: 3 (calc) (ref <5.0)
Triglycerides: 95 mg/dL (ref <150)

## 2018-11-20 LAB — VITAMIN D 25 HYDROXY (VIT D DEFICIENCY, FRACTURES): Vit D, 25-Hydroxy: 29 ng/mL — ABNORMAL LOW (ref 30–100)

## 2018-11-20 LAB — HEMOGLOBIN A1C
Hgb A1c MFr Bld: 6.8 % of total Hgb — ABNORMAL HIGH (ref <5.7)
Mean Plasma Glucose: 148 (calc)
eAG (mmol/L): 8.2 (calc)

## 2018-11-21 ENCOUNTER — Ambulatory Visit (INDEPENDENT_AMBULATORY_CARE_PROVIDER_SITE_OTHER): Payer: PRIVATE HEALTH INSURANCE | Admitting: Family Medicine

## 2018-11-21 ENCOUNTER — Encounter: Payer: Self-pay | Admitting: Family Medicine

## 2018-11-21 ENCOUNTER — Other Ambulatory Visit: Payer: Self-pay

## 2018-11-21 VITALS — BP 113/66 | Temp 98.6°F | Ht 72.0 in | Wt 310.2 lb

## 2018-11-21 DIAGNOSIS — Z0000 Encounter for general adult medical examination without abnormal findings: Secondary | ICD-10-CM

## 2018-11-21 DIAGNOSIS — I10 Essential (primary) hypertension: Secondary | ICD-10-CM

## 2018-11-21 DIAGNOSIS — Z6841 Body Mass Index (BMI) 40.0 and over, adult: Secondary | ICD-10-CM

## 2018-11-21 DIAGNOSIS — E1169 Type 2 diabetes mellitus with other specified complication: Secondary | ICD-10-CM

## 2018-11-21 DIAGNOSIS — E785 Hyperlipidemia, unspecified: Secondary | ICD-10-CM

## 2018-11-21 MED ORDER — PRAVASTATIN SODIUM 20 MG PO TABS: 20.0000 mg | ORAL_TABLET | Freq: Every day | ORAL | 3 refills | Status: DC

## 2018-11-21 NOTE — Patient Instructions (Addendum)
Thank you for coming to the office today.  Recent Labs    02/15/18 1007 11/19/18 0801  HGBA1C 6.3* 6.8*    Great lab results.  Keep improving back on track with lifestyle.  Reminders - Dr Ellin Mayhew in October  Next visit only A1c, no blood work  Refilled Pravastatin cholesterol med  Colon Cancer Screening: - For all adults age 45+ routine colon cancer screening is highly recommended.     - Recent guidelines from [place] recommend starting age of 58 - Early detection of colon cancer is important, because often there are no warning signs or symptoms, also if found early usually it can be cured. Late stage is hard to treat.  - If you are not interested in Colonoscopy screening (if done and normal you could be cleared for 5 to 10 years until next due), then Cologuard is an excellent alternative for screening test for Colon Cancer. It is highly sensitive for detecting DNA of colon cancer from even the earliest stages. Also, there is NO bowel prep required. - If Cologuard is NEGATIVE, then it is good for 3 years before next due - If Cologuard is POSITIVE, then it is strongly advised to get a Colonoscopy, which allows the GI doctor to locate the source of the cancer or polyp (even very early stage) and treat it by removing it. ------------------------- If you would like to proceed with Cologuard (stool DNA test) - FIRST, call your insurance company and tell them you want to check cost of Cologuard tell them CPT Code [phone] (it may be completely covered and you could get for no cost, OR max cost without any coverage is about $600). Also, keep in mind if you do NOT open the kit, and decide not to do the test, you will NOT be charged, you should contact the company if you decide not to do the test. - If you want to proceed, you can notify us (phone message, [place], or at next visit) and we will order it for you. The test kit will be delivered to you house within about 1  week. Follow instructions to collect sample, you may call the company for any help or questions, 24/7 telephone support at [phone].   Please schedule a Follow-up Appointment to: Return in about 6 months (around 05/23/2019) for 6 month DM A1c.  If you have any other questions or concerns, please feel free to call the office or send a message through [place]. You may also schedule an earlier appointment if necessary.  Additionally, you may be receiving a survey about your experience at our office within a few days to 1 week by e-mail or mail. We value your feedback.  Nobie Putnam, DO [place]

## 2018-11-21 NOTE — Assessment & Plan Note (Signed)
Well controlled diabetes, with mild elevated A1c 6.8 now, still well at goal < 7 Overall improved on GLP1 top dose Ozempic and lifestyle improvements Complicated by HTN. HLD. Morbid obesity. Failed SGLT2 (jardiance - UTI), Actos  Plan:  1. Continue current dose Ozempic 1mg  weekly [place] injection - Continue Metformin 1000mg  BID for now future may reduce dose or DC  - Encouraged continue work on implementing healthy DM diet, limited portion, reviewed reduced carb plan, continue improve hydration, continue regular exercise, treadmill Continue ARB, statin - Dr Ellin Mayhew DM Eye exam 02/2018, updated on chart. - DM Foot exam today Follow-up q 6 month DM A1c

## 2018-11-21 NOTE — Assessment & Plan Note (Signed)
Mostly controlled cholesterol on pravastatin - with lifestyle improvements Last lipid panel 10/2018  Plan: 1. Continue current meds - Pravastatin 20mg  daily 2. Encourage improved lifestyle - low carb/cholesterol, reduce portion size, continue improving regular exercise 3. Follow-up yearly lipids

## 2018-11-21 NOTE — Progress Notes (Signed)
Subjective:    Patient ID: Colton Long, male    DOB: [birth year], 43 y.o.   MRN: [id number]  Colton Long is a 43 y.o. male presenting on 11/21/2018 for Annual Exam   HPI   Annual Physical and Lab Review.  CHRONIC DM, Type 2/MORBID OBESITY BMI 42 Follow-up from last visit 01/2018, see prior note - Today reports doing very well CBG - Avg sugars 110-120s, Low >100 no symptoms in past 6 months, < 150 Meds:Ozempic '1mg'$  weekly [place] inj (Saturday), Metformin '1000mg'$  BID Reports good compliance. Tolerating well w/o side-effects Currently on ARB (Losartan) Lifestyle: Weight gain +4-5 lbs in 9 months, more sedentary - Improving carb control DM diet, following a similar ketogenic diet - More sedentary recently but plans to keep improving - Dr Ellin Mayhew DM Eye exam 02/2018, updated on chart. - Denies hypoglycemia  HYPERLIPIDEMIA: - Reports no concerns. Last lipid 10/2018,significantly improvedTG otherwise normal LDL and HDL controlled on statin - Currently taking Pravastatin '20mg'$  daily, tolerating well without side effects or myalgias  Vitamin DDeficiency: - Last lab resultimproved 29.   Chronic Anxiety and Intermittent Stress - Prior history of anxiety >15-20 years ago, treated with Paxilin past, - Currently taking Fluoxetine '20mg'$  daily, had been on from his prior PCP for several years ago, with good results  Health Maintenance: - UTD Vaccines, including pneumonia vaccine, TDAp - Not indicated for early prostate cancer screening  Colon CA Screening: Never had colonoscopy. Currently asymptomatic. No known family history of colon CA. Due for screening test considering Cologuard, counseling given   Depression screen [facility] 2/9 11/21/2018 02/15/2018 10/24/2017  Decreased Interest 0 0 0  Down, Depressed, Hopeless 0 0 0  PHQ - 2 Score 0 0 0   GAD 7 : Generalized Anxiety Score 11/21/2018 10/26/2016 02/29/2016  Nervous, Anxious, on Edge 0 0 0  Control/stop worrying 0 0 0  Worry  too much - different things 0 1 0  Trouble relaxing 0 1 0  Restless 0 0 0  Easily annoyed or irritable 0 0 0  Afraid - awful might happen 0 0 0  Total GAD 7 Score 0 2 0  Anxiety Difficulty Not difficult at all Not difficult at all Not difficult at all     Past Medical History:  Diagnosis Date  . Diabetes mellitus without complication ([place])   . Hypertension   . Sleep apnea    Past Surgical History:  Procedure Laterality Date  . CYSTECTOMY  0865,7846   head   . NASAL SEPTUM SURGERY  2005  . skin lesion exicision  2012   scalp Dr.[facility]   Social History   Socioeconomic History  . Marital status: Married    Spouse name: Not on file  . Number of children: Not on file  . Years of education: Not on file  . Highest education level: Not on file  Occupational History  . Occupation: Engineer, structural  Social Needs  . Financial resource strain: Not on file  . Food insecurity    Worry: Not on file    Inability: Not on file  . Transportation needs    Medical: Not on file    Non-medical: Not on file  Tobacco Use  . Smoking status: Never Smoker  . Smokeless tobacco: Never Used  Substance and Sexual Activity  . Alcohol use: Yes    Alcohol/week: 0.0 standard drinks    Comment: occasional  . Drug use: No  . Sexual activity: Not on file  Lifestyle  .  Physical activity    Days per week: Not on file    Minutes per session: Not on file  . Stress: Not on file  Relationships  . Social Herbalist on phone: Not on file    Gets together: Not on file    Attends religious service: Not on file    Active member of club or organization: Not on file    Attends meetings of clubs or organizations: Not on file    Relationship status: Not on file  . Intimate partner violence    Fear of current or ex partner: Not on file    Emotionally abused: Not on file    Physically abused: Not on file    Forced sexual activity: Not on file  Other Topics Concern  . Not on file  Social  History Narrative  . Not on file   Family History  Problem Relation Age of Onset  . Cancer Father        lung  . Heart disease Father   . Hypertension Father   . Diabetes Father   . Hypertension Mother   . Diabetes Mother   . Prostate cancer Neg Hx   . Colon cancer Neg Hx    Current Outpatient Medications on File Prior to Visit  Medication Sig  . Blood Glucose Monitoring Suppl (ONE TOUCH ULTRA SYSTEM KIT) w/Device KIT 1 kit by Does not apply route once.  . Cholecalciferol (VITAMIN D-3 PO) Take 1,000 Int'l Units by mouth daily.  Marland Kitchen FLUoxetine (PROZAC) 20 MG capsule TAKE ONE CAPSULE BY MOUTH ONCE DAILY.  Marland Kitchen glucose blood test strip 1 each by Other route as needed for other. Use as instructed  . Insulin Pen Needle (INSUPEN PEN NEEDLES) 32G X 4 MM MISC Use with Ozempic pen inject weekly  . losartan (COZAAR) 50 MG tablet TAKE 1 TABLET BY MOUTH DAILY  . metFORMIN (GLUCOPHAGE) 1000 MG tablet TAKE 1 TABLET BY MOUTH TWICE DAILY WITH A MEAL  . Semaglutide, 1 MG/DOSE, (OZEMPIC, 1 MG/DOSE,) 2 MG/1.5ML SOPN Inject 1 mg into the skin once a week.  . baclofen (LIORESAL) 10 MG tablet Take 0.5-1 tablets (5-10 mg total) by mouth 3 (three) times daily as needed for muscle spasms. (Patient not taking: Reported on 11/21/2018)  . fluticasone (FLONASE) 50 MCG/ACT nasal spray Place 2 sprays into both nostrils daily. Use for 4-6 weeks then stop and use seasonally or as needed. (Patient not taking: Reported on 11/21/2018)   No current facility-administered medications on file prior to visit.     Review of Systems  Constitutional: Negative for activity change, appetite change, chills, diaphoresis, fatigue and fever.  HENT: Negative for congestion and hearing loss.   Eyes: Negative for visual disturbance.  Respiratory: Negative for apnea, cough, chest tightness, shortness of breath and wheezing.   Cardiovascular: Negative for chest pain, palpitations and leg swelling.  Gastrointestinal: Negative for abdominal  pain, anal bleeding, constipation, diarrhea, nausea and vomiting.  Endocrine: Negative for cold intolerance.  Genitourinary: Negative for dysuria, frequency and hematuria.  Musculoskeletal: Negative for arthralgias, back pain and neck pain.  Skin: Negative for rash.  Allergic/Immunologic: Negative for environmental allergies.  Neurological: Negative for dizziness, weakness, light-headedness, numbness and headaches.  Hematological: Negative for adenopathy.  Psychiatric/Behavioral: Negative for behavioral problems, dysphoric mood and sleep disturbance. The patient is not nervous/anxious.    Per HPI unless specifically indicated above     Objective:    BP 113/66 (BP Location: Left Arm, Patient Position: Sitting,  Cuff Size: Large)   Temp 98.6 F (37 C) (Oral)   Ht 6' (1.829 m)   Wt (!) 310 lb 3.2 oz (140.7 kg)   BMI 42.07 kg/m   Wt Readings from Last 3 Encounters:  11/21/18 (!) 310 lb 3.2 oz (140.7 kg)  02/15/18 (!) 304 lb (137.9 kg)  10/24/17 (!) 313 lb (142 kg)    Physical Exam Vitals signs and nursing note reviewed.  Constitutional:      General: He is not in acute distress.    Appearance: He is well-developed. He is not diaphoretic.     Comments: Well-appearing, comfortable, cooperative, obese  HENT:     Head: Normocephalic and atraumatic.  Eyes:     General:        Right eye: No discharge.        Left eye: No discharge.     Conjunctiva/sclera: Conjunctivae normal.     Pupils: Pupils are equal, round, and reactive to light.  Neck:     Musculoskeletal: Normal range of motion and neck supple.     Thyroid: No thyromegaly.  Cardiovascular:     Rate and Rhythm: Normal rate and regular rhythm.     Heart sounds: Normal heart sounds. No murmur.  Pulmonary:     Effort: Pulmonary effort is normal. No respiratory distress.     Breath sounds: Normal breath sounds. No wheezing or rales.  Abdominal:     General: Bowel sounds are normal. There is no distension.     Palpations:  Abdomen is soft. There is no mass.     Tenderness: There is no abdominal tenderness.  Musculoskeletal: Normal range of motion.        General: No tenderness.     Comments: Upper / Lower Extremities: - Normal muscle tone, strength bilateral upper extremities 5/5, lower extremities 5/5  Lymphadenopathy:     Cervical: No cervical adenopathy.  Skin:    General: Skin is warm and dry.     Findings: No erythema or rash.  Neurological:     Mental Status: He is alert and oriented to person, place, and time.     Comments: Distal sensation intact to light touch all extremities  Psychiatric:        Behavior: Behavior normal.     Comments: Well groomed, good eye contact, normal speech and thoughts      Diabetic Foot Exam - Simple   Simple Foot Form Diabetic Foot exam was performed with the following findings: Yes 11/21/2018 10:40 AM  Visual Inspection No deformities, no ulcerations, no other skin breakdown bilaterally: Yes Sensation Testing Intact to touch and monofilament testing bilaterally: Yes Pulse Check Posterior Tibialis and Dorsalis pulse intact bilaterally: Yes Comments    Recent Labs    02/15/18 1007 11/19/18 0801  HGBA1C 6.3* 6.8*     Results for orders placed or performed in visit on 10/10/18  VITAMIN D 25 Hydroxy (Vit-D Deficiency, Fractures)  Result Value Ref Range   Vit D, 25-Hydroxy 29 (L) 30 - 100 ng/mL  Lipid panel  Result Value Ref Range   Cholesterol 137 <200 mg/dL   HDL 46 > OR = 40 mg/dL   Triglycerides 95 <150 mg/dL   LDL Cholesterol (Calc) 73 mg/dL (calc)   Total CHOL/HDL Ratio 3.0 <5.0 (calc)   Non-HDL Cholesterol (Calc) 91 <130 mg/dL (calc)  COMPLETE METABOLIC PANEL WITH GFR  Result Value Ref Range   Glucose, Bld 156 (H) 65 - 99 mg/dL   BUN 11 7 - 25  mg/dL   Creat 0.83 0.60 - 1.35 mg/dL   GFR, Est Non African American 106 > OR = 60 mL/min/1.65m   GFR, Est African American 123 > OR = 60 mL/min/1.714m  BUN/Creatinine Ratio NOT APPLICABLE 6 - 22  (calc)   Sodium 138 135 - 146 mmol/L   Potassium 4.5 3.5 - 5.3 mmol/L   Chloride 105 98 - 110 mmol/L   CO2 26 20 - 32 mmol/L   Calcium 9.0 8.6 - 10.3 mg/dL   Total Protein 6.2 6.1 - 8.1 g/dL   Albumin 3.9 3.6 - 5.1 g/dL   Globulin 2.3 1.9 - 3.7 g/dL (calc)   AG Ratio 1.7 1.0 - 2.5 (calc)   Total Bilirubin 0.7 0.2 - 1.2 mg/dL   Alkaline phosphatase (APISO) 46 36 - 130 U/L   AST 19 10 - 40 U/L   ALT 33 9 - 46 U/L  CBC with Differential/Platelet  Result Value Ref Range   WBC 4.7 3.8 - 10.8 Thousand/uL   RBC 4.71 4.20 - 5.80 Million/uL   Hemoglobin 14.2 13.2 - 17.1 g/dL   HCT 42.5 38.5 - 50.0 %   MCV 90.2 80.0 - 100.0 fL   MCH 30.1 27.0 - 33.0 pg   MCHC 33.4 32.0 - 36.0 g/dL   RDW 12.8 11.0 - 15.0 %   Platelets 293 140 - 400 Thousand/uL   MPV 10.7 7.5 - 12.5 fL   Neutro Abs 2,557 1,500 - 7,800 cells/uL   Lymphs Abs 1,518 850 - 3,900 cells/uL   Absolute Monocytes 470 200 - 950 cells/uL   Eosinophils Absolute 113 15 - 500 cells/uL   Basophils Absolute 42 0 - 200 cells/uL   Neutrophils Relative % 54.4 %   Total Lymphocyte 32.3 %   Monocytes Relative 10.0 %   Eosinophils Relative 2.4 %   Basophils Relative 0.9 %  Hemoglobin A1c  Result Value Ref Range   Hgb A1c MFr Bld 6.8 (H) <5.7 % of total Hgb   Mean Plasma Glucose 148 (calc)   eAG (mmol/L) 8.2 (calc)      Assessment & Plan:   Problem List Items Addressed This Visit    Essential hypertension    Well-controlled HTN - Home BP readings normal  No known complications     Plan:  1.  Continue current BP regimen Losartan '50mg'$  daily 2. Encourage improved lifestyle - low sodium diet, regular exercise 3. Continue monitor BP outside office, bring readings to next visit, if persistently >140/90 or new symptoms notify office sooner      Relevant Medications   pravastatin (PRAVACHOL) 20 MG tablet   Hyperlipidemia associated with type 2 diabetes mellitus ([place]   Mostly controlled cholesterol on pravastatin - with lifestyle  improvements Last lipid panel 10/2018  Plan: 1. Continue current meds - Pravastatin '20mg'$  daily 2. Encourage improved lifestyle - low carb/cholesterol, reduce portion size, continue improving regular exercise 3. Follow-up yearly lipids      Relevant Medications   pravastatin (PRAVACHOL) 20 MG tablet   Morbid obesity with BMI of 40.0-44.9, adult (HCC)    Improved weight loss, some recent gain Improved on GLP1 and lifestyle      Type 2 diabetes mellitus with other specified complication (HCC)    Well controlled diabetes, with mild elevated A1c 6.8 now, still well at goal < 7 Overall improved on GLP1 top dose Ozempic and lifestyle improvements Complicated by HTN. HLD. Morbid obesity. Failed SGLT2 (jardiance - UTI), Actos  Plan:  1. Continue  current dose Ozempic '1mg'$  weekly [place] injection - Continue Metformin '1000mg'$  BID for now future may reduce dose or DC  - Encouraged continue work on implementing healthy DM diet, limited portion, reviewed reduced carb plan, continue improve hydration, continue regular exercise, treadmill Continue ARB, statin - Dr Ellin Mayhew DM Eye exam 02/2018, updated on chart. - DM Foot exam today Follow-up q 6 month DM A1c      Relevant Medications   pravastatin (PRAVACHOL) 20 MG tablet    Other Visit Diagnoses    Annual physical exam    -  Primary      Updated Health Maintenance information  Due for routine colon cancer screening, age 47. Never had colonoscopy (not interested), no family history colon cancer. - Discussion today about recommendations for either Colonoscopy or Cologuard screening, benefits and risks of screening, interested in Cologuard, understands that if positive then recommendation is for diagnostic colonoscopy to follow-up.  - Patient advised to contact insurance first to learn cost, will notify us when ready for [place] to order Cologuard  Reviewed recent lab results with patient Encouraged improvement to lifestyle with diet and exercise -  Goal of weight loss   Meds ordered this encounter  Medications  . pravastatin (PRAVACHOL) 20 MG tablet    Sig: Take 1 tablet (20 mg total) by mouth daily.    Dispense:  90 tablet    Refill:  3    Follow up plan: Return in about 6 months (around 05/23/2019) for 6 month DM A1c.  Nobie Putnam, [facility] 11/21/2018, 10:22 AM

## 2018-11-21 NOTE — Assessment & Plan Note (Addendum)
Well-controlled HTN - Home BP readings normal  No known complications     Plan:  1.  Continue current BP regimen Losartan 50mg daily 2. Encourage improved lifestyle - low sodium diet, regular exercise 3. Continue monitor BP outside office, bring readings to next visit, if persistently >140/90 or new symptoms notify office sooner 

## 2018-11-21 NOTE — Assessment & Plan Note (Signed)
Improved weight loss, some recent gain Improved on GLP1 and lifestyle

## 2018-11-23 ENCOUNTER — Other Ambulatory Visit: Payer: Self-pay | Admitting: Family Medicine

## 2018-11-23 DIAGNOSIS — E785 Hyperlipidemia, unspecified: Secondary | ICD-10-CM

## 2018-11-23 DIAGNOSIS — E1169 Type 2 diabetes mellitus with other specified complication: Secondary | ICD-10-CM

## 2018-12-11 ENCOUNTER — Other Ambulatory Visit: Payer: Self-pay | Admitting: Family Medicine

## 2018-12-11 DIAGNOSIS — E1169 Type 2 diabetes mellitus with other specified complication: Secondary | ICD-10-CM

## 2018-12-31 ENCOUNTER — Other Ambulatory Visit: Payer: Self-pay | Admitting: Family Medicine

## 2018-12-31 DIAGNOSIS — F419 Anxiety disorder, unspecified: Secondary | ICD-10-CM

## 2019-05-27 ENCOUNTER — Other Ambulatory Visit: Payer: Self-pay | Admitting: Family Medicine

## 2019-05-27 DIAGNOSIS — E1169 Type 2 diabetes mellitus with other specified complication: Secondary | ICD-10-CM

## 2019-06-03 ENCOUNTER — Ambulatory Visit: Payer: PRIVATE HEALTH INSURANCE | Admitting: Family Medicine

## 2019-06-17 ENCOUNTER — Ambulatory Visit: Payer: PRIVATE HEALTH INSURANCE | Admitting: Family Medicine

## 2019-06-19 ENCOUNTER — Telehealth: Payer: Self-pay

## 2019-06-19 DIAGNOSIS — E1169 Type 2 diabetes mellitus with other specified complication: Secondary | ICD-10-CM

## 2019-06-19 NOTE — Telephone Encounter (Signed)
The pt came in the office for A1C check  prior to his telehealth visit on tomorrrow.

## 2019-06-20 ENCOUNTER — Encounter: Payer: Self-pay | Admitting: Family Medicine

## 2019-06-20 ENCOUNTER — Ambulatory Visit (INDEPENDENT_AMBULATORY_CARE_PROVIDER_SITE_OTHER): Payer: PRIVATE HEALTH INSURANCE | Admitting: Family Medicine

## 2019-06-20 ENCOUNTER — Other Ambulatory Visit: Payer: Self-pay

## 2019-06-20 DIAGNOSIS — E1169 Type 2 diabetes mellitus with other specified complication: Secondary | ICD-10-CM | POA: Diagnosis not present

## 2019-06-20 DIAGNOSIS — F419 Anxiety disorder, unspecified: Secondary | ICD-10-CM

## 2019-06-20 LAB — HEMOGLOBIN A1C
Hgb A1c MFr Bld: 7.2 % of total Hgb — ABNORMAL HIGH (ref <5.7)
Mean Plasma Glucose: 160 (calc)
eAG (mmol/L): 8.9 (calc)

## 2019-06-20 MED ORDER — FLUOXETINE HCL 20 MG PO CAPS: ORAL_CAPSULE | ORAL | 3 refills | Status: DC

## 2019-06-20 MED ORDER — METFORMIN HCL 1000 MG PO TABS: 1000.0000 mg | ORAL_TABLET | Freq: Two times a day (BID) | ORAL | 3 refills | Status: DC

## 2019-06-20 NOTE — Patient Instructions (Addendum)
Keep up good work.  DUE for FASTING BLOOD WORK (no food or drink after midnight before the lab appointment, only water or coffee without cream/sugar on the morning of)  SCHEDULE "Lab Only" visit in the morning at the clinic for lab draw in 5 MONTHS   - Make sure Lab Only appointment is at about 1 week before your next appointment, so that results will be available  For Lab Results, once available within 2-3 days of blood draw, you can can log in to MyChart online to view your results and a brief explanation. Also, we can discuss results at next follow-up visit.   Please schedule a Follow-up Appointment to: Return in about 5 months (around 11/18/2019) for 5 months Annual Physical.  If you have any other questions or concerns, please feel free to call the office or send a message through MyChart. You may also schedule an earlier appointment if necessary.  Additionally, you may be receiving a survey about your experience at our office within a few days to 1 week by e-mail or mail. We value your feedback.  Saralyn Pilar, DO [facility], [place]

## 2019-06-20 NOTE — Assessment & Plan Note (Signed)
Controlled DM A1c up to 7.2 Overall improved on GLP1 top dose Ozempic and lifestyle improvements - with weight loss Complicated by HTN. HLD. Morbid obesity. Failed SGLT2 (jardiance - UTI), Actos  Plan:  1. Continue current dose Ozempic 1mg  weekly [place] injection - Continue Metformin 1000mg  BID for now still, refilled - Encouraged continue work on implementing healthy DM diet, limited portion, reviewed reduced carb plan, continue improve hydration, continue regular exercise, treadmill Continue ARB, statin

## 2019-06-20 NOTE — Progress Notes (Signed)
Virtual Visit via Telephone The purpose of this virtual visit is to provide medical care while limiting exposure to the novel coronavirus (COVID19) for both patient and office staff.  Consent was obtained for phone visit:  Yes.   Answered questions that patient had about telehealth interaction:  Yes.   I discussed the limitations, risks, security and privacy concerns of performing an evaluation and management service by telephone. I also discussed with the patient that there may be a patient responsible charge related to this service. The patient expressed understanding and agreed to proceed.  Patient Location: Home Provider Location: [facility])  ---------------------------------------------------------------------- Chief Complaint  Patient presents with  . Diabetes    S: Reviewed CMA documentation. I have called patient and gathered additional HPI as follows:  CHRONIC DM, Type 2/MORBID OBESITY BMI 42 Last visit 10/2018 - Today reports doing very well. CBG -Avg sugars 110-130s, Low >100 no symptoms in past 6 months, < 150 Meds:Ozempic '1mg'$  weekly  inj (Saturday), Metformin '1000mg'$  BID - needs refill Reports good compliance. Tolerating well w/o side-effects Currently on ARB (Losartan) Lifestyle: Weight down overall - Improving carb control DM diet, following a similar ketogenic diet - Less actively lately with recent episode of COVID - Dr Ellin Mayhew DM Eye exam 02/2018, updated on chart. - Denies hypoglycemia  Chronic Anxiety and Intermittent Stress - Prior history of anxiety >15-20 years ago, treated with Paxilin past, - Currently taking Fluoxetine '20mg'$  daily, had been on from his prior PCP for several years ago, with good results  History of COVID19 Previous exposure and infection in Dec 2020, he has recovered well, reduced smell still bothering him, says 50% improved.  Patient is currently working  Denies any high risk travel to areas of current  concern for [place]. Denies any known or suspected exposure to person with or possibly with COVID19.  Denies any fevers, chills, sweats, body ache, cough, shortness of breath, sinus pain or pressure, headache, abdominal pain, diarrhea  Past Medical History:  Diagnosis Date  . Diabetes mellitus without complication ([place])   . Hypertension   . Sleep apnea    Social History   Tobacco Use  . Smoking status: Never Smoker  . Smokeless tobacco: Never Used  Substance Use Topics  . Alcohol use: Yes    Alcohol/week: 0.0 standard drinks    Comment: occasional  . Drug use: No    Current Outpatient Medications:  .  Blood Glucose Monitoring Suppl (ONE TOUCH ULTRA SYSTEM KIT) w/Device KIT, 1 kit by Does not apply route once., Disp: , Rfl:  .  FLUoxetine (PROZAC) 20 MG capsule, TAKE 1 CAPSULE BY MOUTH EVERY DAY, Disp: 90 capsule, Rfl: 3 .  glucose blood test strip, 1 each by Other route as needed for other. Use as instructed, Disp: , Rfl:  .  Insulin Pen Needle (INSUPEN PEN NEEDLES) 32G X 4 MM MISC, Use with Ozempic pen inject weekly, Disp: 30 each, Rfl: 3 .  losartan (COZAAR) 50 MG tablet, TAKE 1 TABLET BY MOUTH DAILY, Disp: 90 tablet, Rfl: 3 .  metFORMIN (GLUCOPHAGE) 1000 MG tablet, Take 1 tablet (1,000 mg total) by mouth 2 (two) times daily with a meal., Disp: 180 tablet, Rfl: 3 .  OZEMPIC, 1 MG/DOSE, 2 MG/1.5ML SOPN, INJECT 1 MG UNDER THE SKIN ONCE A WEEK, Disp: 3 mL, Rfl: 5 .  pravastatin (PRAVACHOL) 20 MG tablet, Take 1 tablet (20 mg total) by mouth daily., Disp: 90 tablet, Rfl: 3 .  Cholecalciferol (VITAMIN D-3 PO),  Take 1,000 Int'l Units by mouth daily., Disp: , Rfl:  .  fluticasone (FLONASE) 50 MCG/ACT nasal spray, Place 2 sprays into both nostrils daily. Use for 4-6 weeks then stop and use seasonally or as needed. (Patient not taking: Reported on 11/21/2018), Disp: 16 g, Rfl: 3  Depression screen [facility] 2/9 11/21/2018 02/15/2018 10/24/2017  Decreased Interest 0 0 0  Down, Depressed, Hopeless 0  0 0  PHQ - 2 Score 0 0 0    GAD 7 : Generalized Anxiety Score 11/21/2018 10/26/2016 02/29/2016  Nervous, Anxious, on Edge 0 0 0  Control/stop worrying 0 0 0  Worry too much - different things 0 1 0  Trouble relaxing 0 1 0  Restless 0 0 0  Easily annoyed or irritable 0 0 0  Afraid - awful might happen 0 0 0  Total GAD 7 Score 0 2 0  Anxiety Difficulty Not difficult at all Not difficult at all Not difficult at all    -------------------------------------------------------------------------- O: No physical exam performed due to remote telephone encounter.  Lab results reviewed.  Recent Results (from the past 2160 hour(s))  Hemoglobin A1c     Status: Abnormal   Collection Time: 06/19/19  8:03 AM  Result Value Ref Range   Hgb A1c MFr Bld 7.2 (H) <5.7 % of total Hgb    Comment: For someone without known diabetes, a hemoglobin A1c value of 6.5% or greater indicates that they may have  diabetes and this should be confirmed with a follow-up  test. . For someone with known diabetes, a value <7% indicates  that their diabetes is well controlled and a value  greater than or equal to 7% indicates suboptimal  control. A1c targets should be individualized based on  duration of diabetes, age, comorbid conditions, and  other considerations. . Currently, no consensus exists regarding use of hemoglobin A1c for diagnosis of diabetes for children. .    Mean Plasma Glucose 160 (calc)   eAG (mmol/L) 8.9 (calc)   Recent Labs    11/19/18 0801 06/19/19 0803  HGBA1C 6.8* 7.2*     -------------------------------------------------------------------------- A&P:  Problem List Items Addressed This Visit    Type 2 diabetes mellitus with other specified complication ([place]) - Primary    Controlled DM A1c up to 7.2 Overall improved on GLP1 top dose Ozempic and lifestyle improvements - with weight loss Complicated by HTN. HLD. Morbid obesity. Failed SGLT2 (jardiance - UTI), Actos  Plan:  1.  Continue current dose Ozempic '1mg'$  weekly [place] injection - Continue Metformin '1000mg'$  BID for now still, refilled - Encouraged continue work on implementing healthy DM diet, limited portion, reviewed reduced carb plan, continue improve hydration, continue regular exercise, treadmill Continue ARB, statin      Relevant Medications   metFORMIN (GLUCOPHAGE) 1000 MG tablet   Anxiety disorder    Stable, controlled without panic Refilled Fluoxetine '20mg'$  daily      Relevant Medications   FLUoxetine (PROZAC) 20 MG capsule      Meds ordered this encounter  Medications  . metFORMIN (GLUCOPHAGE) 1000 MG tablet    Sig: Take 1 tablet (1,000 mg total) by mouth 2 (two) times daily with a meal.    Dispense:  180 tablet    Refill:  3  . FLUoxetine (PROZAC) 20 MG capsule    Sig: TAKE 1 CAPSULE BY MOUTH EVERY DAY    Dispense:  90 capsule    Refill:  3    Follow-up: - Return in 5 months for Annual  Physical - Future labs ordered for 10/2019  Patient verbalizes understanding with the above medical recommendations including the limitation of remote medical advice.  Specific follow-up and call-back criteria were given for patient to follow-up or seek medical care more urgently if needed.   - Time spent in direct consultation with patient on phone: 8 minutes   Nobie Putnam, [facility] 06/20/2019, 8:08 AM

## 2019-06-20 NOTE — Assessment & Plan Note (Signed)
Stable, controlled without panic Refilled Fluoxetine 20mg  daily

## 2019-08-21 ENCOUNTER — Other Ambulatory Visit: Payer: Self-pay | Admitting: Family Medicine

## 2019-08-21 DIAGNOSIS — E1169 Type 2 diabetes mellitus with other specified complication: Secondary | ICD-10-CM

## 2019-09-02 ENCOUNTER — Other Ambulatory Visit: Payer: Self-pay | Admitting: Family Medicine

## 2019-09-02 DIAGNOSIS — I10 Essential (primary) hypertension: Secondary | ICD-10-CM

## 2019-10-16 ENCOUNTER — Other Ambulatory Visit: Payer: Self-pay | Admitting: Family Medicine

## 2019-10-16 DIAGNOSIS — E1169 Type 2 diabetes mellitus with other specified complication: Secondary | ICD-10-CM

## 2019-10-16 MED ORDER — OZEMPIC (1 MG/DOSE) 2 MG/1.5ML ~~LOC~~ SOPN: PEN_INJECTOR | SUBCUTANEOUS | 1 refills | Status: DC

## 2019-10-16 NOTE — Telephone Encounter (Signed)
OZEMPIC, 1 MG/DOSE, 2 MG/1.5ML SOPN     Patient requesting a refill.    Pharmacy:  [id number] Cheree Ditto, [place] - [address] AT [facility] OF [address] [facility] Phone:  [phone]  Fax:  [phone]

## 2019-11-13 ENCOUNTER — Telehealth: Payer: Self-pay | Admitting: Family Medicine

## 2019-11-13 DIAGNOSIS — E1169 Type 2 diabetes mellitus with other specified complication: Secondary | ICD-10-CM

## 2019-11-13 DIAGNOSIS — Z1159 Encounter for screening for other viral diseases: Secondary | ICD-10-CM

## 2019-11-13 DIAGNOSIS — Z6841 Body Mass Index (BMI) 40.0 and over, adult: Secondary | ICD-10-CM

## 2019-11-13 DIAGNOSIS — E785 Hyperlipidemia, unspecified: Secondary | ICD-10-CM

## 2019-11-13 DIAGNOSIS — I10 Essential (primary) hypertension: Secondary | ICD-10-CM

## 2019-11-13 DIAGNOSIS — Z0000 Encounter for general adult medical examination without abnormal findings: Secondary | ICD-10-CM

## 2019-11-13 DIAGNOSIS — E559 Vitamin D deficiency, unspecified: Secondary | ICD-10-CM

## 2019-11-13 NOTE — Telephone Encounter (Signed)
Signed orders  Add tsh vit d and hep c  Saralyn Pilar, DO [facility] 11/13/2019, 4:55 PM

## 2019-11-18 ENCOUNTER — Other Ambulatory Visit: Payer: Self-pay

## 2019-11-18 ENCOUNTER — Other Ambulatory Visit: Payer: PRIVATE HEALTH INSURANCE

## 2019-11-18 DIAGNOSIS — E559 Vitamin D deficiency, unspecified: Secondary | ICD-10-CM

## 2019-11-18 DIAGNOSIS — E785 Hyperlipidemia, unspecified: Secondary | ICD-10-CM

## 2019-11-18 DIAGNOSIS — E1169 Type 2 diabetes mellitus with other specified complication: Secondary | ICD-10-CM

## 2019-11-18 DIAGNOSIS — Z0000 Encounter for general adult medical examination without abnormal findings: Secondary | ICD-10-CM

## 2019-11-18 DIAGNOSIS — I10 Essential (primary) hypertension: Secondary | ICD-10-CM

## 2019-11-18 DIAGNOSIS — Z1159 Encounter for screening for other viral diseases: Secondary | ICD-10-CM

## 2019-11-19 LAB — COMPLETE METABOLIC PANEL WITH GFR
AG Ratio: 1.7 (calc) (ref 1.0–2.5)
ALT: 37 U/L (ref 9–46)
AST: 17 U/L (ref 10–40)
Albumin: 4.3 g/dL (ref 3.6–5.1)
Alkaline phosphatase (APISO): 48 U/L (ref 36–130)
BUN: 14 mg/dL (ref 7–25)
CO2: 26 mmol/L (ref 20–32)
Calcium: 9.5 mg/dL (ref 8.6–10.3)
Chloride: 104 mmol/L (ref 98–110)
Creat: 0.83 mg/dL (ref 0.60–1.35)
GFR, Est African American: 122 mL/min/{1.73_m2} (ref >=60)
GFR, Est Non African American: 106 mL/min/{1.73_m2} (ref >=60)
Globulin: 2.6 g/dL (calc) (ref 1.9–3.7)
Glucose, Bld: 140 mg/dL — ABNORMAL HIGH (ref 65–99)
Potassium: 4.9 mmol/L (ref 3.5–5.3)
Sodium: 139 mmol/L (ref 135–146)
Total Bilirubin: 0.5 mg/dL (ref 0.2–1.2)
Total Protein: 6.9 g/dL (ref 6.1–8.1)

## 2019-11-19 LAB — LIPID PANEL
Cholesterol: 158 mg/dL (ref <200)
HDL: 52 mg/dL (ref >=40)
LDL Cholesterol (Calc): 83 mg/dL (calc)
Non-HDL Cholesterol (Calc): 106 mg/dL (calc) (ref <130)
Total CHOL/HDL Ratio: 3 (calc) (ref <5.0)
Triglycerides: 130 mg/dL (ref <150)

## 2019-11-19 LAB — CBC WITH DIFFERENTIAL/PLATELET
Absolute Monocytes: 462 cells/uL (ref 200–950)
Basophils Absolute: 50 cells/uL (ref 0–200)
Basophils Relative: 0.9 %
Eosinophils Absolute: 99 cells/uL (ref 15–500)
Eosinophils Relative: 1.8 %
HCT: 45.3 % (ref 38.5–50.0)
Hemoglobin: 15 g/dL (ref 13.2–17.1)
Lymphs Abs: 1557 cells/uL (ref 850–3900)
MCH: 30.5 pg (ref 27.0–33.0)
MCHC: 33.1 g/dL (ref 32.0–36.0)
MCV: 92.3 fL (ref 80.0–100.0)
MPV: 10.2 fL (ref 7.5–12.5)
Monocytes Relative: 8.4 %
Neutro Abs: 3333 cells/uL (ref 1500–7800)
Neutrophils Relative %: 60.6 %
Platelets: 301 10*3/uL (ref 140–400)
RBC: 4.91 10*6/uL (ref 4.20–5.80)
RDW: 12.6 % (ref 11.0–15.0)
Total Lymphocyte: 28.3 %
WBC: 5.5 10*3/uL (ref 3.8–10.8)

## 2019-11-19 LAB — TSH: TSH: 1.3 mIU/L (ref 0.40–4.50)

## 2019-11-19 LAB — HEMOGLOBIN A1C
Hgb A1c MFr Bld: 6.5 % of total Hgb — ABNORMAL HIGH (ref <5.7)
Mean Plasma Glucose: 140 (calc)
eAG (mmol/L): 7.7 (calc)

## 2019-11-19 LAB — HEPATITIS C ANTIBODY
Hepatitis C Ab: NONREACTIVE
SIGNAL TO CUT-OFF: 0.01 (ref <1.00)

## 2019-11-19 LAB — VITAMIN D 25 HYDROXY (VIT D DEFICIENCY, FRACTURES): Vit D, 25-Hydroxy: 24 ng/mL — ABNORMAL LOW (ref 30–100)

## 2019-11-25 ENCOUNTER — Other Ambulatory Visit: Payer: Self-pay

## 2019-11-25 ENCOUNTER — Encounter: Payer: Self-pay | Admitting: Family Medicine

## 2019-11-25 ENCOUNTER — Ambulatory Visit (INDEPENDENT_AMBULATORY_CARE_PROVIDER_SITE_OTHER): Payer: PRIVATE HEALTH INSURANCE | Admitting: Family Medicine

## 2019-11-25 VITALS — BP 117/74 | HR 85 | Temp 97.5°F | Resp 16 | Ht 75.0 in | Wt 304.6 lb

## 2019-11-25 DIAGNOSIS — E559 Vitamin D deficiency, unspecified: Secondary | ICD-10-CM

## 2019-11-25 DIAGNOSIS — I10 Essential (primary) hypertension: Secondary | ICD-10-CM

## 2019-11-25 DIAGNOSIS — E1169 Type 2 diabetes mellitus with other specified complication: Secondary | ICD-10-CM

## 2019-11-25 DIAGNOSIS — Z0000 Encounter for general adult medical examination without abnormal findings: Secondary | ICD-10-CM

## 2019-11-25 DIAGNOSIS — E785 Hyperlipidemia, unspecified: Secondary | ICD-10-CM

## 2019-11-25 NOTE — Assessment & Plan Note (Signed)
Controlled cholesterol on pravastatin - with lifestyle improvements Last lipid panel 10/2019, LDL at 83  Plan: 1. Continue current meds - Pravastatin 20mg  daily 2. Encourage improved lifestyle - low carb/cholesterol, reduce portion size, continue improving regular exercise 3. Follow-up yearly lipids

## 2019-11-25 NOTE — Assessment & Plan Note (Signed)
Well-controlled HTN - Home BP readings normal  No known complications     Plan:  1.  Continue current BP regimen Losartan 50mg  daily 2. Encourage improved lifestyle - low sodium diet, regular exercise 3. Continue monitor BP outside office, bring readings to next visit, if persistently >140/90 or new symptoms notify office sooner

## 2019-11-25 NOTE — Patient Instructions (Addendum)
Thank you for coming to the office today.  Keep up the great work overall  Recent Labs    06/19/19 0803 11/18/19 0832  HGBA1C 7.2* 6.5*    Ask pharmacist about Ozempic 3 month refills - see if they can cover this let me know if I should order it differently for 3 month supply  Try Vitamin D3 2,000 once daily  ---------------------------  Remember to schedule - Diabetic Eye Exam with Dr [facility]   Address: [address], [place] [address] Phone: [phone]  Website: [email or web address]   Colon Cancer Screening: - For all adults age 29+ routine colon cancer screening is highly recommended.     - Recent guidelines from [place] recommend starting age of 8 - Early detection of colon cancer is important, because often there are no warning signs or symptoms, also if found early usually it can be cured. Late stage is hard to treat.  - If you are not interested in Colonoscopy screening (if done and normal you could be cleared for 5 to 10 years until next due), then Cologuard is an excellent alternative for screening test for Colon Cancer. It is highly sensitive for detecting DNA of colon cancer from even the earliest stages. Also, there is NO bowel prep required. - If Cologuard is NEGATIVE, then it is good for 3 years before next due - If Cologuard is POSITIVE, then it is strongly advised to get a Colonoscopy, which allows the GI doctor to locate the source of the cancer or polyp (even very early stage) and treat it by removing it. ------------------------- If you would like to proceed with Cologuard (stool DNA test) - FIRST, call your insurance company and tell them you want to check cost of Cologuard tell them CPT Code [phone] (it may be completely covered and you could get for no cost, OR max cost without any coverage is about $600). Also, keep in mind if you do NOT open the kit, and decide not to do the test, you will NOT be charged, you should  contact the company if you decide not to do the test. - If you want to proceed, you can notify us (phone message, [place], or at next visit) and we will order it for you. The test kit will be delivered to you house within about 1 week. Follow instructions to collect sample, you may call the company for any help or questions, 24/7 telephone support at [phone].   Please schedule a Follow-up Appointment to: Return in about 5 months (around 04/26/2020) for 5 month DM A1c.  If you have any other questions or concerns, please feel free to call the office or send a message through [employer]. You may also schedule an earlier appointment if necessary.  Additionally, you may be receiving a survey about your experience at our office within a few days to 1 week by e-mail or mail. We value your feedback.  Nobie Putnam, DO [place]

## 2019-11-25 NOTE — Assessment & Plan Note (Signed)
Controlled DM A1c at 6.5, improved Overall improved on GLP1 top dose Ozempic and lifestyle improvements - with weight loss Complicated by HTN. HLD. Morbid obesity. Failed SGLT2 (jardiance - UTI), Actos  Plan:  1. Continue current dose Ozempic 1mg  weekly [place] injection - Continue Metformin 1000mg  BID for now still, refilled - Encouraged continue work on implementing healthy DM diet, limited portion, reviewed reduced carb plan, continue improve hydration, continue regular exercise, treadmill Continue ARB, statin

## 2019-11-25 NOTE — Progress Notes (Signed)
Subjective:    Patient ID: Colton Long, male    DOB: 1975/09/05, 44 y.o.   MRN: [id number]  Colton Long is a 44 y.o. male presenting on 11/25/2019 for Annual Exam   HPI   Annual Physical and Lab Review.  CHRONIC DM, Type 2/MORBID OBESITY BMI 38 Last visit DM visit telemedicine 05/2019 - Today reports doing very well. CBG -Avg sugars still similar to before 100-130s, Low >100no symptoms in past 6 months,< 150 Meds:Ozempic 27m weekly [place] inj (Saturday), Metformin 10050mBID Reports good compliance. Tolerating well w/o side-effects Currently on ARB (Losartan) Lifestyle: Weight down overall - doing well on ozempic - Improving carb control DM diet, following a similar ketogenic diet - Dr WoEllin MayhewM Eye exam 02/2018 was last exam, he will work on scheduling for 2021. - Denies hypoglycemia  HYPERLIPIDEMIA: - Reports no concerns. Last lipid 10/2019, well controlled - Currently taking Pravastatin 2044maily, tolerating well without side effects or myalgias  Vitamin DDeficiency: - Last lab result24, prior 29 range. He is off supplement. Was not sure if it had any benefits.  Chronic Anxiety and Intermittent Stress - Prior history of anxiety >15-20 years ago, treated with Paxilin past, - Currently taking Fluoxetine 89m67mily, had been on from his prior PCP for several years ago, with good results  Health Maintenance: - UTD Vaccines, including pneumonia vaccine, TDAp - Not indicated for early prostate cancer screening  Colon CA Screening: Never had colonoscopy. Currently asymptomatic. No known family history of colon CA. Due for screening test considering Cologuard, counseling given  Depression screen PHQ [facility] 11/25/2019 11/21/2018 02/15/2018  Decreased Interest 0 0 0  Down, Depressed, Hopeless 0 0 0  PHQ - 2 Score 0 0 0    Past Medical History:  Diagnosis Date  . Hypertension   . Sleep apnea    Past Surgical History:  Procedure Laterality Date  .  CYSTECTOMY  [id number]   . NASAL SEPTUM SURGERY  2005  . skin lesion exicision  2012   scalp Dr.[facility]   Social History   Socioeconomic History  . Marital status: Married    Spouse name: Not on file  . Number of children: Not on file  . Years of education: Not on file  . Highest education level: Not on file  Occupational History  . Occupation: PoliEngineer, structuralbacco Use  . Smoking status: Never Smoker  . Smokeless tobacco: Never Used  Vaping Use  . Vaping Use: Never used  Substance and Sexual Activity  . Alcohol use: Yes    Alcohol/week: 0.0 standard drinks    Comment: occasional  . Drug use: No  . Sexual activity: Not on file  Other Topics Concern  . Not on file  Social History Narrative  . Not on file   Social Determinants of Health   Financial Resource Strain:   . Difficulty of Paying Living Expenses:   Food Insecurity:   . Worried About RunnCharity fundraiserthe Last Year:   . Ran Arboriculturistthe Last Year:   Transportation Needs:   . LackFilm/video editordical):   . LaMarland Kitchenk of Transportation (Non-Medical):   Physical Activity:   . Days of Exercise per Week:   . Minutes of Exercise per Session:   Stress:   . Feeling of Stress :   Social Connections:   . Frequency of Communication with Friends and Family:   . Frequency of Social Gatherings with  Friends and Family:   . Attends Religious Services:   . Active Member of Clubs or Organizations:   . Attends Archivist Meetings:   Marland Kitchen Marital Status:   Intimate Partner Violence:   . Fear of Current or Ex-Partner:   . Emotionally Abused:   Marland Kitchen Physically Abused:   . Sexually Abused:    Family History  Problem Relation Age of Onset  . Cancer Father        lung  . Heart disease Father   . Hypertension Father   . Diabetes Father   . Hypertension Mother   . Diabetes Mother   . Prostate cancer Neg Hx   . Colon cancer Neg Hx    Current Outpatient Medications on File Prior to Visit    Medication Sig  . Blood Glucose Monitoring Suppl (ONE TOUCH ULTRA SYSTEM KIT) w/Device KIT 1 kit by Does not apply route once.  . Cholecalciferol (VITAMIN D-3 PO) Take 2,000 Int'l Units by mouth daily.  Marland Kitchen FLUoxetine (PROZAC) 20 MG capsule TAKE 1 CAPSULE BY MOUTH EVERY DAY  . fluticasone (FLONASE) 50 MCG/ACT nasal spray Place 2 sprays into both nostrils daily. Use for 4-6 weeks then stop and use seasonally or as needed.  Marland Kitchen glucose blood test strip 1 each by Other route as needed for other. Use as instructed  . Insulin Pen Needle (INSUPEN PEN NEEDLES) 32G X 4 MM MISC Use with Ozempic pen inject weekly  . losartan (COZAAR) 50 MG tablet TAKE 1 TABLET BY MOUTH DAILY  . metFORMIN (GLUCOPHAGE) 1000 MG tablet Take 1 tablet (1,000 mg total) by mouth 2 (two) times daily with a meal.  . pravastatin (PRAVACHOL) 20 MG tablet TAKE 1 TABLET(20 MG) BY MOUTH DAILY  . Semaglutide, 1 MG/DOSE, (OZEMPIC, 1 MG/DOSE,) 2 MG/1.5ML SOPN INJECT 1 MG UNDER THE SKIN ONCE A WEEK   No current facility-administered medications on file prior to visit.    Review of Systems  Constitutional: Negative for activity change, appetite change, chills, diaphoresis, fatigue and fever.  HENT: Negative for congestion and hearing loss.   Eyes: Negative for visual disturbance.  Respiratory: Negative for apnea, cough, chest tightness, shortness of breath and wheezing.   Cardiovascular: Negative for chest pain, palpitations and leg swelling.  Gastrointestinal: Negative for abdominal pain, anal bleeding, blood in stool, constipation, diarrhea, nausea and vomiting.  Endocrine: Negative for cold intolerance.  Genitourinary: Negative for difficulty urinating, dysuria, frequency and hematuria.  Musculoskeletal: Negative for arthralgias, back pain and neck pain.  Skin: Negative for rash.  Allergic/Immunologic: Negative for environmental allergies.  Neurological: Negative for dizziness, weakness, light-headedness, numbness and headaches.   Hematological: Negative for adenopathy.  Psychiatric/Behavioral: Negative for behavioral problems, dysphoric mood and sleep disturbance. The patient is not nervous/anxious.    Per HPI unless specifically indicated above      Objective:    BP 117/74   Pulse 85   Temp (!) 97.5 F (36.4 C) (Temporal)   Resp 16   Ht _0  (1.905 m)   Wt (!) 304 lb 9.6 oz (138.2 kg)   SpO2 98%   BMI 38.07 kg/m   Wt Readings from Last 3 Encounters:  11/25/19 (!) 304 lb 9.6 oz (138.2 kg)  11/21/18 (!) 310 lb 3.2 oz (140.7 kg)  02/15/18 (!) 304 lb (137.9 kg)    Physical Exam Vitals and nursing note reviewed.  Constitutional:      General: He is not in acute distress.    Appearance: He is well-developed. He  is obese. He is not diaphoretic.     Comments: Well-appearing, comfortable, cooperative  HENT:     Head: Normocephalic and atraumatic.     Right Ear: Tympanic membrane and ear canal normal.     Left Ear: Tympanic membrane and ear canal normal.  Eyes:     General:        Right eye: No discharge.        Left eye: No discharge.     Conjunctiva/sclera: Conjunctivae normal.     Pupils: Pupils are equal, round, and reactive to light.  Neck:     Thyroid: No thyromegaly.     Vascular: No carotid bruit.  Cardiovascular:     Rate and Rhythm: Normal rate and regular rhythm.     Heart sounds: Normal heart sounds. No murmur heard.   Pulmonary:     Effort: Pulmonary effort is normal. No respiratory distress.     Breath sounds: Normal breath sounds. No wheezing or rales.  Abdominal:     General: Bowel sounds are normal. There is no distension.     Palpations: Abdomen is soft. There is no mass.     Tenderness: There is no abdominal tenderness.  Musculoskeletal:        General: No tenderness. Normal range of motion.     Cervical back: Normal range of motion and neck supple.     Comments: Upper / Lower Extremities: - Normal muscle tone, strength bilateral upper extremities 5/5, lower extremities  5/5  Lymphadenopathy:     Cervical: No cervical adenopathy.  Skin:    General: Skin is warm and dry.     Findings: No erythema or rash.  Neurological:     Mental Status: He is alert and oriented to person, place, and time.     Comments: Distal sensation intact to light touch all extremities  Psychiatric:        Behavior: Behavior normal.     Comments: Well groomed, good eye contact, normal speech and thoughts         Diabetic Foot Exam - Simple   Simple Foot Form Diabetic Foot exam was performed with the following findings: Yes 11/25/2019  8:47 AM  Visual Inspection No deformities, no ulcerations, no other skin breakdown bilaterally: Yes Sensation Testing Intact to touch and monofilament testing bilaterally: Yes Pulse Check Posterior Tibialis and Dorsalis pulse intact bilaterally: Yes Comments      Results for orders placed or performed in visit on 11/18/19  Hepatitis C antibody  Result Value Ref Range   Hepatitis C Ab NON-REACTIVE NON-REACTI   SIGNAL TO CUT-OFF 0.01 <1.00  TSH  Result Value Ref Range   TSH 1.30 0.40 - 4.50 mIU/L  VITAMIN D 25 Hydroxy (Vit-D Deficiency, Fractures)  Result Value Ref Range   Vit D, 25-Hydroxy 24 (L) 30 - 100 ng/mL  Lipid panel  Result Value Ref Range   Cholesterol 158 <200 mg/dL   HDL 52 > OR = 40 mg/dL   Triglycerides 130 <150 mg/dL   LDL Cholesterol (Calc) 83 mg/dL (calc)   Total CHOL/HDL Ratio 3.0 <5.0 (calc)   Non-HDL Cholesterol (Calc) 106 <130 mg/dL (calc)  Hemoglobin A1c  Result Value Ref Range   Hgb A1c MFr Bld 6.5 (H) <5.7 % of total Hgb   Mean Plasma Glucose 140 (calc)   eAG (mmol/L) 7.7 (calc)  CBC with Differential/Platelet  Result Value Ref Range   WBC 5.5 3.8 - 10.8 Thousand/uL   RBC 4.91 4.20 - 5.80 Million/uL  Hemoglobin 15.0 13.2 - 17.1 g/dL   HCT 45.3 38 - 50 %   MCV 92.3 80.0 - 100.0 fL   MCH 30.5 27.0 - 33.0 pg   MCHC 33.1 32.0 - 36.0 g/dL   RDW 12.6 11.0 - 15.0 %   Platelets 301 140 - 400  Thousand/uL   MPV 10.2 7.5 - 12.5 fL   Neutro Abs 3,333 1,500 - 7,800 cells/uL   Lymphs Abs 1,557 850 - 3,900 cells/uL   Absolute Monocytes 462 200 - 950 cells/uL   Eosinophils Absolute 99 15 - 500 cells/uL   Basophils Absolute 50 0 - 200 cells/uL   Neutrophils Relative % 60.6 %   Total Lymphocyte 28.3 %   Monocytes Relative 8.4 %   Eosinophils Relative 1.8 %   Basophils Relative 0.9 %  COMPLETE METABOLIC PANEL WITH GFR  Result Value Ref Range   Glucose, Bld 140 (H) 65 - 99 mg/dL   BUN 14 7 - 25 mg/dL   Creat 0.83 0.60 - 1.35 mg/dL   GFR, Est Non African American 106 > OR = 60 mL/min/1.65m   GFR, Est African American 122 > OR = 60 mL/min/1.762m  BUN/Creatinine Ratio NOT APPLICABLE 6 - 22 (calc)   Sodium 139 135 - 146 mmol/L   Potassium 4.9 3.5 - 5.3 mmol/L   Chloride 104 98 - 110 mmol/L   CO2 26 20 - 32 mmol/L   Calcium 9.5 8.6 - 10.3 mg/dL   Total Protein 6.9 6.1 - 8.1 g/dL   Albumin 4.3 3.6 - 5.1 g/dL   Globulin 2.6 1.9 - 3.7 g/dL (calc)   AG Ratio 1.7 1.0 - 2.5 (calc)   Total Bilirubin 0.5 0.2 - 1.2 mg/dL   Alkaline phosphatase (APISO) 48 36 - 130 U/L   AST 17 10 - 40 U/L   ALT 37 9 - 46 U/L      Assessment & Plan:   Problem List Items Addressed This Visit    Vitamin D deficiency    Mild low Vit D 24 May take Vit D3 2k daily supplement      Type 2 diabetes mellitus with other specified complication (HCC)    Controlled DM A1c at 6.5, improved Overall improved on GLP1 top dose Ozempic and lifestyle improvements - with weight loss Complicated by HTN. HLD. Morbid obesity. Failed SGLT2 (jardiance - UTI), Actos  Plan:  1. Continue current dose Ozempic 9m73meekly [place] injection - Continue Metformin 1000m32mD for now still, refilled - Encouraged continue work on implementing healthy DM diet, limited portion, reviewed reduced carb plan, continue improve hydration, continue regular exercise, treadmill Continue ARB, statin      Morbid obesity (HCC)[place] Morbid obesity  with BMI >38. Comorbid conditions Type 2 Diabetes, Hypertension, Hyperlipidemia. Encourage lifestyle diet exercise weight loss Continue on GLP1 medication      Hyperlipidemia associated with type 2 diabetes mellitus (HCC)[place] Controlled cholesterol on pravastatin - with lifestyle improvements Last lipid panel 10/2019, LDL at 83  Plan: 1. Continue current meds - Pravastatin 20mg8mly 2. Encourage improved lifestyle - low carb/cholesterol, reduce portion size, continue improving regular exercise 3. Follow-up yearly lipids      Essential hypertension    Well-controlled HTN - Home BP readings normal  No known complications     Plan:  1.  Continue current BP regimen Losartan 50mg 85my 2. Encourage improved lifestyle - low sodium diet, regular exercise 3. Continue monitor BP outside office, bring readings  to next visit, if persistently >140/90 or new symptoms notify office sooner       Other Visit Diagnoses    Annual physical exam    -  Primary      Updated Health Maintenance information - Hep C negative routine antibody - Colon CA screening at age 42+, (5) he will check into Cologuard cost/coverage Due for routine colon cancer screening. Never had colonoscopy (not interested), no family history colon cancer. - Discussion today about recommendations for either Colonoscopy or Cologuard screening, benefits and risks of screening, interested in Cologuard, understands that if positive then recommendation is for diagnostic colonoscopy to follow-up. Patient advised to contact insurance first to learn cost, will notify us when ready for [place] to order Cologuard  Reviewed recent lab results with patient Encouraged improvement to lifestyle with diet and exercise - Goal of weight loss   No orders of the defined types were placed in this encounter.    Follow up plan: Return in about 5 months (around 04/26/2020) for 5 month DM A1c.   Nobie Putnam, [facility] 11/25/2019, 8:49 AM

## 2019-11-25 NOTE — Assessment & Plan Note (Signed)
Morbid obesity with BMI >38. Comorbid conditions Type 2 Diabetes, Hypertension, Hyperlipidemia. Encourage lifestyle diet exercise weight loss Continue on GLP1 medication

## 2019-11-25 NOTE — Assessment & Plan Note (Signed)
Mild low Vit D 24 May take Vit D3 2k daily supplement

## 2020-01-02 ENCOUNTER — Other Ambulatory Visit: Payer: Self-pay | Admitting: Family Medicine

## 2020-01-02 DIAGNOSIS — E1169 Type 2 diabetes mellitus with other specified complication: Secondary | ICD-10-CM

## 2020-01-02 NOTE — Telephone Encounter (Signed)
Requested Prescriptions  Pending Prescriptions Disp Refills  . 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 . For someone with known diabetes, a value <7% indicates  that their diabetes is well controlled and a value  greater than or equal to 7% indicates suboptimal  control. A1c targets should be individualized based on  duration of diabetes, age, comorbid conditions, and  other considerations. . Currently, no consensus exists regarding use of hemoglobin A1c for diagnosis of diabetes for children. 11/20/2019 - Valid encounter within last 6 months    Recent Outpatient Visits          1 month ago Annual physical exam   [facility], DO   6 months ago Type 2 diabetes mellitus with other specified complication, without long-term current use of insulin [facility])   [facility], Althea Charon, DO   1 year ago Annual physical exam   [facility], DO   1 year ago Type 2 diabetes mellitus with other specified complication, without long-term current use of insulin [facility])   [facility], DO   2 years ago Annual physical exam   [facility], DO      Future  Appointments            In 3 months Smitty Cords, Althea Charon, DO [facility]

## 2020-02-27 ENCOUNTER — Other Ambulatory Visit: Payer: Self-pay | Admitting: Family Medicine

## 2020-02-27 DIAGNOSIS — E1169 Type 2 diabetes mellitus with other specified complication: Secondary | ICD-10-CM

## 2020-04-26 ENCOUNTER — Other Ambulatory Visit: Payer: Self-pay | Admitting: Family Medicine

## 2020-04-26 DIAGNOSIS — E1169 Type 2 diabetes mellitus with other specified complication: Secondary | ICD-10-CM

## 2020-04-26 NOTE — Telephone Encounter (Signed)
Requested Prescriptions  Pending Prescriptions Disp Refills  . 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 . For someone with known diabetes, a value <7% indicates  that their diabetes is well controlled and a value  greater than or equal to 7% indicates suboptimal  control. A1c targets should be individualized based on  duration of diabetes, age, comorbid conditions, and  other considerations. . Currently, no consensus exists regarding use of hemoglobin A1c for diagnosis of diabetes for children. 11/20/2019 - Valid encounter within last 6 months    Recent Outpatient Visits          5 months ago Annual physical exam   [facility], DO   10 months ago Type 2 diabetes mellitus with other specified complication, without long-term current use of insulin [facility])   [facility], Althea Charon, DO   1 year ago Annual physical exam   [facility], DO   2 years ago Type 2 diabetes mellitus with other specified complication, without long-term current use of insulin (HCC)   [facility], DO   2 years ago Annual physical exam   [facility], DO      Future  Appointments            In 2 weeks Smitty Cords, Althea Charon, DO [facility]           \

## 2020-04-30 ENCOUNTER — Ambulatory Visit: Payer: PRIVATE HEALTH INSURANCE | Admitting: Family Medicine

## 2020-05-02 ENCOUNTER — Other Ambulatory Visit: Payer: Self-pay | Admitting: Family Medicine

## 2020-05-02 DIAGNOSIS — E1169 Type 2 diabetes mellitus with other specified complication: Secondary | ICD-10-CM

## 2020-05-02 NOTE — Telephone Encounter (Signed)
Requested Prescriptions  Pending Prescriptions Disp Refills  . 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 . For someone with known diabetes, a value <7% indicates  that their diabetes is well controlled and a value  greater than or equal to 7% indicates suboptimal  control. A1c targets should be individualized based on  duration of diabetes, age, comorbid conditions, and  other considerations. . Currently, no consensus exists regarding use of hemoglobin A1c for diagnosis of diabetes for children. .          Passed - eGFR in normal range and within 360 days    GFR, Est African American  Date Value Ref Range Status  11/18/2019 122 > OR = 60 mL/min/1.27m2 Final   GFR, Est Non African American  Date Value Ref Range Status  11/18/2019 106 > OR = 60 mL/min/1.18m2 Final         Passed - Valid encounter within last 6 months    Recent Outpatient Visits          5 months ago Annual physical exam   [facility] Olin Hauser, DO   10 months ago Type 2 diabetes mellitus with other specified complication, without long-term current use of insulin ([place])   [place], DO   1 year ago Annual physical exam   [facility] Olin Hauser, DO   2 years ago Type 2 diabetes mellitus with other specified complication, without long-term current use of insulin ([place])   [facility] [place], Devonne Doughty, DO   2 years ago Annual physical exam   [place], DO      Future Appointments            In 1 week [place], Devonne Doughty, [facility], [facility]

## 2020-05-13 ENCOUNTER — Other Ambulatory Visit: Payer: Self-pay

## 2020-05-13 ENCOUNTER — Ambulatory Visit (INDEPENDENT_AMBULATORY_CARE_PROVIDER_SITE_OTHER): Payer: PRIVATE HEALTH INSURANCE | Admitting: Family Medicine

## 2020-05-13 ENCOUNTER — Encounter: Payer: Self-pay | Admitting: Family Medicine

## 2020-05-13 ENCOUNTER — Other Ambulatory Visit: Payer: Self-pay | Admitting: Family Medicine

## 2020-05-13 VITALS — BP 102/66 | HR 87 | Temp 97.5°F | Resp 16 | Ht 75.0 in | Wt 287.0 lb

## 2020-05-13 DIAGNOSIS — Z23 Encounter for immunization: Secondary | ICD-10-CM

## 2020-05-13 DIAGNOSIS — E1169 Type 2 diabetes mellitus with other specified complication: Secondary | ICD-10-CM | POA: Diagnosis not present

## 2020-05-13 DIAGNOSIS — I10 Essential (primary) hypertension: Secondary | ICD-10-CM

## 2020-05-13 DIAGNOSIS — E785 Hyperlipidemia, unspecified: Secondary | ICD-10-CM

## 2020-05-13 DIAGNOSIS — F419 Anxiety disorder, unspecified: Secondary | ICD-10-CM

## 2020-05-13 DIAGNOSIS — Z125 Encounter for screening for malignant neoplasm of prostate: Secondary | ICD-10-CM

## 2020-05-13 DIAGNOSIS — Z0000 Encounter for general adult medical examination without abnormal findings: Secondary | ICD-10-CM

## 2020-05-13 LAB — POCT GLYCOSYLATED HEMOGLOBIN (HGB A1C): Hemoglobin A1C: 6 % — AB (ref 4.0–5.6)

## 2020-05-13 MED ORDER — METFORMIN HCL 1000 MG PO TABS: 1000.0000 mg | ORAL_TABLET | Freq: Two times a day (BID) | ORAL | 3 refills | Status: DC

## 2020-05-13 NOTE — Assessment & Plan Note (Signed)
Weight loss successful over past 6 months diet lifestyle exercise medication GLP1

## 2020-05-13 NOTE — Assessment & Plan Note (Signed)
Controlled DM A1c at 6.0 improved Overall improved on GLP1 top dose Ozempic and lifestyle improvements Complicated by HTN. HLD. Morbid obesity. Failed SGLT2 (jardiance - UTI), Actos  Plan:  1. Continue current dose Ozempic 1mg  weekly [place] injection - Continue Metformin 1000mg  BID for now still, refilled - Encouraged continue work on implementing healthy DM diet, limited portion, reviewed reduced carb plan, continue improve hydration, continue regular exercise, treadmill Continue ARB, statin

## 2020-05-13 NOTE — Assessment & Plan Note (Signed)
Well-controlled HTN - low normal - Home BP readings normal  No known complications     Plan:  1. REDUCE Losartan to 25mg  daily - take half pill of 50mg  for now, message when ready to reduce to new rx 25mg  tablet daily, given weight loss able to reduce BP med 2. Encourage improved lifestyle - low sodium diet, regular exercise 3. Continue monitor BP outside office, bring readings to next visit, if persistently >140/90 or new symptoms notify office sooner

## 2020-05-13 NOTE — Assessment & Plan Note (Signed)
Stable, controlled On Fluoxetine 20mg daily 

## 2020-05-13 NOTE — Progress Notes (Signed)
Subjective:    Patient ID: Colton Long, male    DOB: 1976/01/24, 44 y.o.   MRN: [id number]  Colton Long is a 44 y.o. male presenting on 05/13/2020 for Diabetes   HPI   CHRONIC DM, Type 2/MORBID OBESITY BMI 35 Last visit 10/2019 annual - Today reports doing very well. Due A1c today CBG -Avg sugars still similar to before 100-130s, Low >100no symptoms in past 6 months,< 150 Meds:Ozempic 1mg  weekly [place] inj (Saturday), Metformin 1000mg  BID Reports good compliance. Tolerating well w/o side-effects Currently on ARB (Losartan) Lifestyle: Weightdown overall 17 lbs in about 6 months - Improving carb control DM diet, following a similar ketogenic diet - Improving exercise regimen - Dr 04-26-2002 DM Eye exam upcoming to be scheduled. - Denies hypoglycemia  CHRONIC HTN: Reports recent BP lower, weight loss Current Meds - Losartan 50mg  daily   Reports good compliance, took meds today. Tolerating well, w/o complaints. Denies CP, dyspnea, HA, edema, dizziness / lightheadedness  HYPERLIPIDEMIA: - Reports no concerns. Last lipid6/2021, well controlled - Currently taking Pravastatin 20mg  daily, tolerating well without side effects or myalgias  Chronic Anxiety and Intermittent Stress - Prior history of anxiety >15-20 years ago, treated with Paxilin past, - Currently taking Fluoxetine 20mg  daily, had been on from his prior PCP for several years ago, with good results  Health Maintenance: - UTD Vaccines, including pneumonia vaccine, TDAp - Not indicated for early prostate cancer screening  Colon CA Screening: Never had colonoscopy. Currently asymptomatic. No known family history of colon CA. Due for screening test considering Cologuard, counseling given   Depression screen [facility] 2/9 05/13/2020 11/25/2019 11/21/2018  Decreased Interest 0 0 0  Down, Depressed, Hopeless 0 0 0  PHQ - 2 Score 0 0 0   GAD 7 : Generalized Anxiety Score 05/13/2020 11/21/2018 10/26/2016 02/29/2016   Nervous, Anxious, on Edge 0 0 0 0  Control/stop worrying 0 0 0 0  Worry too much - different things 0 0 1 0  Trouble relaxing 0 0 1 0  Restless 0 0 0 0  Easily annoyed or irritable 0 0 0 0  Afraid - awful might happen 0 0 0 0  Total GAD 7 Score 0 0 2 0  Anxiety Difficulty Not difficult at all Not difficult at all Not difficult at all Not difficult at all      Social History   Tobacco Use  . Smoking status: Never Smoker  . Smokeless tobacco: Never Used  Vaping Use  . Vaping Use: Never used  Substance Use Topics  . Alcohol use: Yes    Alcohol/week: 0.0 standard drinks    Comment: occasional  . Drug use: No    Review of Systems Per HPI unless specifically indicated above     Objective:    BP 102/66   Pulse 87   Temp (!) 97.5 F (36.4 C) (Temporal)   Resp 16   Ht 6\' 3"  (1.905 m)   Wt 287 lb (130.2 kg)   SpO2 99%   BMI 35.87 kg/m   Wt Readings from Last 3 Encounters:  05/13/20 287 lb (130.2 kg)  11/25/19 (!) 304 lb 9.6 oz (138.2 kg)  11/21/18 (!) 310 lb 3.2 oz (140.7 kg)    Physical Exam Vitals and nursing note reviewed.  Constitutional:      General: He is not in acute distress.    Appearance: He is well-developed and well-nourished. He is obese. He is not diaphoretic.     Comments: Well-appearing,  comfortable, cooperative  HENT:     Head: Normocephalic and atraumatic.     Mouth/Throat:     Mouth: Oropharynx is clear and moist.  Eyes:     General:        Right eye: No discharge.        Left eye: No discharge.     Conjunctiva/sclera: Conjunctivae normal.  Neck:     Thyroid: No thyromegaly.  Cardiovascular:     Rate and Rhythm: Normal rate and regular rhythm.     Pulses: Intact distal pulses.     Heart sounds: Normal heart sounds. No murmur heard.   Pulmonary:     Effort: Pulmonary effort is normal. No respiratory distress.     Breath sounds: Normal breath sounds. No wheezing or rales.  Musculoskeletal:        General: No edema. Normal range of  motion.     Cervical back: Normal range of motion and neck supple.  Lymphadenopathy:     Cervical: No cervical adenopathy.  Skin:    General: Skin is warm and dry.     Findings: No erythema or rash.  Neurological:     Mental Status: He is alert and oriented to person, place, and time.  Psychiatric:        Mood and Affect: Mood and affect normal.        Behavior: Behavior normal.     Comments: Well groomed, good eye contact, normal speech and thoughts       Results for orders placed or performed in visit on 05/13/20  POCT HgB A1C  Result Value Ref Range   Hemoglobin A1C 6.0 (A) 4.0 - 5.6 %      Assessment & Plan:   Problem List Items Addressed This Visit    Type 2 diabetes mellitus with other specified complication (HCC) - Primary    Controlled DM A1c at 6.0 improved Overall improved on GLP1 top dose Ozempic and lifestyle improvements Complicated by HTN. HLD. Morbid obesity. Failed SGLT2 (jardiance - UTI), Actos  Plan:  1. Continue current dose Ozempic 1mg  weekly [place] injection - Continue Metformin 1000mg  BID for now still, refilled - Encouraged continue work on implementing healthy DM diet, limited portion, reviewed reduced carb plan, continue improve hydration, continue regular exercise, treadmill Continue ARB, statin      Relevant Medications   metFORMIN (GLUCOPHAGE) 1000 MG tablet   losartan (COZAAR) 50 MG tablet   Other Relevant Orders   POCT HgB A1C (Completed)   Morbid obesity (HCC)    Weight loss successful over past 6 months diet lifestyle exercise medication GLP1      Relevant Medications   metFORMIN (GLUCOPHAGE) 1000 MG tablet   Essential hypertension    Well-controlled HTN - low normal - Home BP readings normal  No known complications     Plan:  1. REDUCE Losartan to 25mg  daily - take half pill of 50mg  for now, message when ready to reduce to new rx 25mg  tablet daily, given weight loss able to reduce BP med 2. Encourage improved lifestyle - low  sodium diet, regular exercise 3. Continue monitor BP outside office, bring readings to next visit, if persistently >140/90 or new symptoms notify office sooner      Relevant Medications   losartan (COZAAR) 50 MG tablet   Anxiety disorder    Stable, controlled On Fluoxetine 20mg  daily       Other Visit Diagnoses    Needs flu shot       Relevant  Orders   Flu Vaccine QUAD 36+ mos IM (Completed)       Meds ordered this encounter  Medications  . metFORMIN (GLUCOPHAGE) 1000 MG tablet    Sig: Take 1 tablet (1,000 mg total) by mouth 2 (two) times daily with a meal.    Dispense:  180 tablet    Refill:  3    Add refills      Follow up plan: Return in about 6 months (around 11/11/2020) for 6 month fasting lab only then 1 week later Annual Physical.  Future labs ordered for 11/04/20   Saralyn Pilar, DO [facility] 05/13/2020, 8:53 AM

## 2020-05-13 NOTE — Patient Instructions (Addendum)
Thank you for coming to the office today.  Reduce Losartan from 50mg  down to 25mg  - take a half of a 50mg  pill every day, check BP monitor and if you are ready to reduce dose officially for a 25mg  tab, message me and let me know when ready. With weight loss and lifestyle improvement you likely don't need a full 50mg  dose.  Recent Labs    06/19/19 0803 11/18/19 0832 05/13/20 0854  HGBA1C 7.2* 6.5* 6.0*   Recommend scheduling  [facility]   Address: [address] [place], 11/20/19 05/15/20 Phone: [phone]  Website: [email or web address]   Flu shot today    DUE for FASTING BLOOD WORK (no food or drink after midnight before the lab appointment, only water or coffee without cream/sugar on the morning of)  SCHEDULE "Lab Only" visit in the morning at the clinic for lab draw in 6 MONTHS   - Make sure Lab Only appointment is at about 1 week before your next appointment, so that results will be available  For Lab Results, once available within 2-3 days of blood draw, you can can log in to MyChart online to view your results and a brief explanation. Also, we can discuss results at next follow-up visit.   Please schedule a Follow-up Appointment to: Return in about 6 months (around 11/11/2020) for 6 month fasting lab only then 1 week later Annual Physical.  If you have any other questions or concerns, please feel free to call the office or send a message through MyChart. You may also schedule an earlier appointment if necessary.  Additionally, you may be receiving a survey about your experience at our office within a few days to 1 week by e-mail or mail. We value your feedback.  THE LAURELS, DO [facility], [id number]

## 2020-05-29 ENCOUNTER — Other Ambulatory Visit: Payer: Self-pay | Admitting: Family Medicine

## 2020-05-29 DIAGNOSIS — I10 Essential (primary) hypertension: Secondary | ICD-10-CM

## 2020-06-20 ENCOUNTER — Other Ambulatory Visit: Payer: Self-pay | Admitting: Family Medicine

## 2020-06-20 DIAGNOSIS — E1169 Type 2 diabetes mellitus with other specified complication: Secondary | ICD-10-CM

## 2020-06-20 NOTE — Telephone Encounter (Signed)
Requested Prescriptions  Pending Prescriptions Disp Refills  . OZEMPIC, 1 MG/DOSE, 4 MG/3ML SOPN [Pharmacy Med Name: OZEMPIC 1MG  PER DOSE (1X4MG  PEN)] 3 mL 1    Sig: INJECT 1 MG UNDER THE SKIN ONCE A WEEK     Endocrinology:  Diabetes - GLP-1 Receptor Agonists Passed - 06/20/2020 10:02 AM      Passed - HBA1C is between 0 and 7.9 and within 180 days    Hemoglobin A1C  Date Value Ref Range Status  05/13/2020 6.0 (A) 4.0 - 5.6 % Final  12/27/2016 6.2  Final    Comment:    Employee health screening from work   Hgb A1c MFr Bld  Date Value Ref Range Status  11/18/2019 6.5 (H) <5.7 % of total Hgb Final    Comment:    For someone without known diabetes, a hemoglobin A1c value of 6.5% or greater indicates that they may have  diabetes and this should be confirmed with a follow-up  test. . For someone with known diabetes, a value <7% indicates  that their diabetes is well controlled and a value  greater than or equal to 7% indicates suboptimal  control. A1c targets should be individualized based on  duration of diabetes, age, comorbid conditions, and  other considerations. . Currently, no consensus exists regarding use of hemoglobin A1c for diagnosis of diabetes for children. 11/20/2019 - Valid encounter within last 6 months    Recent Outpatient Visits          1 month ago Type 2 diabetes mellitus with other specified complication, without long-term current use of insulin (HCC)   [facility], [facility], DO   6 months ago Annual physical exam   [facility], DO   1 year ago Type 2 diabetes mellitus with other specified complication, without long-term current use of insulin [facility])   [facility], DO   1 year ago Annual physical exam   [facility], DO   2 years ago Type 2 diabetes mellitus with other specified complication,  without long-term current use of insulin (HCC)   [facility], Althea Charon, DO      Future Appointments            In 4 months Netta Neat, Althea Charon, DO [facility], [facility]

## 2020-06-24 ENCOUNTER — Other Ambulatory Visit: Payer: Self-pay | Admitting: Family Medicine

## 2020-06-24 DIAGNOSIS — F419 Anxiety disorder, unspecified: Secondary | ICD-10-CM

## 2020-06-24 NOTE — Telephone Encounter (Signed)
Future visit in 4 months 

## 2020-08-16 ENCOUNTER — Other Ambulatory Visit: Payer: Self-pay | Admitting: Family Medicine

## 2020-08-16 DIAGNOSIS — E1169 Type 2 diabetes mellitus with other specified complication: Secondary | ICD-10-CM

## 2020-08-16 NOTE — Telephone Encounter (Signed)
Requested Prescriptions  Pending Prescriptions Disp Refills  . 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 . For someone with known diabetes, a value <7% indicates  that their diabetes is well controlled and a value  greater than or equal to 7% indicates suboptimal  control. A1c targets should be individualized based on  duration of diabetes, age, comorbid conditions, and  other considerations. . Currently, no consensus exists regarding use of hemoglobin A1c for diagnosis of diabetes for children. 12/29/2016 - Valid encounter within last 6 months    Recent Outpatient Visits          3 months ago Type 2 diabetes mellitus with other specified complication, without long-term current use of insulin (HCC)   [facility], Verna Czech, DO   8 months ago Annual physical exam   [facility] Netta Neat, DO   1 year ago Type 2 diabetes mellitus with other specified complication, without long-term current use of insulin [facility])   [facility], DO   1 year ago Annual physical exam   [facility] Smitty Cords, DO   2 years ago Type 2 diabetes mellitus with other specified complication,  without long-term current use of insulin (HCC)   [facility] Smitty Cords, [facility], DO      Future Appointments            In 2 months Althea Charon, Netta Neat, DO [facility], [facility]

## 2020-08-29 ENCOUNTER — Other Ambulatory Visit: Payer: Self-pay | Admitting: Family Medicine

## 2020-08-29 DIAGNOSIS — E1169 Type 2 diabetes mellitus with other specified complication: Secondary | ICD-10-CM

## 2020-08-29 DIAGNOSIS — E785 Hyperlipidemia, unspecified: Secondary | ICD-10-CM

## 2020-08-29 NOTE — Telephone Encounter (Signed)
Requested Prescriptions  Pending Prescriptions Disp Refills  . pravastatin (PRAVACHOL) 20 MG tablet [Pharmacy Med Name: PRAVASTATIN 20MG  TABLETS] 90 tablet 0    Sig: TAKE 1 TABLET(20 MG) BY MOUTH DAILY     Cardiovascular:  Antilipid - Statins Passed - 08/29/2020  9:52 AM      Passed - Total Cholesterol in normal range and within 360 days    Cholesterol, Total  Date Value Ref Range Status  03/23/2015 175 100 - 199 mg/dL Final   Cholesterol  Date Value Ref Range Status  11/18/2019 158 <200 mg/dL Final         Passed - LDL in normal range and within 360 days    LDL Cholesterol (Calc)  Date Value Ref Range Status  11/18/2019 83 mg/dL (calc) Final    Comment:    Reference range: <100 . Desirable range <100 mg/dL for primary prevention;   <70 mg/dL for patients with CHD or diabetic patients  with > or = 2 CHD risk factors. 11/20/2019 LDL-C is now calculated using the Martin-Hopkins  calculation, which is a validated novel method providing  better accuracy than the Friedewald equation in the  estimation of LDL-C.  Marland Kitchen et al. Horald Pollen. Lenox Ahr): 2061-2068  (http://education.[employer].com/faq/FAQ164)          Passed - HDL in normal range and within 360 days    HDL  Date Value Ref Range Status  11/18/2019 52 > OR = 40 mg/dL Final  11/20/2019 45 10/93/2355 mg/dL Final    Comment:    According to ATP-III Guidelines, HDL-C >59 mg/dL is considered a negative risk factor for CHD.          Passed - Triglycerides in normal range and within 360 days    Triglycerides  Date Value Ref Range Status  11/18/2019 130 <150 mg/dL Final         Passed - Patient is not pregnant      Passed - Valid encounter within last 12 months    Recent Outpatient Visits          3 months ago Type 2 diabetes mellitus with other specified complication, without long-term current use of insulin (HCC)   [facility], [facility], DO   9 months ago Annual physical exam   [facility] [place], [place], DO   1 year ago Type 2 diabetes mellitus with other specified complication, without long-term current use of insulin [facility])   [facility], Althea Charon, DO   1 year ago Annual physical exam   [facility], DO   2 years ago Type 2 diabetes mellitus with other specified complication, without long-term current use of insulin (HCC)   [facility], Althea Charon, DO      Future Appointments            In 2 months Netta Neat, Althea Charon, DO [facility], [facility]

## 2020-09-03 ENCOUNTER — Other Ambulatory Visit: Payer: Self-pay | Admitting: Family Medicine

## 2020-09-03 DIAGNOSIS — I10 Essential (primary) hypertension: Secondary | ICD-10-CM

## 2020-09-03 NOTE — Telephone Encounter (Signed)
Requested Prescriptions  Pending Prescriptions Disp Refills  . losartan (COZAAR) 50 MG tablet [Pharmacy Med Name: LOSARTAN 50MG  TABLETS] 90 tablet 0    Sig: TAKE 1 TABLET BY MOUTH DAILY     Cardiovascular:  Angiotensin Receptor Blockers Failed - 09/03/2020  9:46 PM      Failed - Cr in normal range and within 180 days    Creat  Date Value Ref Range Status  11/18/2019 0.83 0.60 - 1.35 mg/dL Final         Failed - K in normal range and within 180 days    Potassium  Date Value Ref Range Status  11/18/2019 4.9 3.5 - 5.3 mmol/L Final         Passed - Patient is not pregnant      Passed - Last BP in normal range    BP Readings from Last 1 Encounters:  05/13/20 102/66         Passed - Valid encounter within last 6 months    Recent Outpatient Visits          3 months ago Type 2 diabetes mellitus with other specified complication, without long-term current use of insulin (HCC)   [facility], DO   9 months ago Annual physical exam   [facility] [place], [place], DO   1 year ago Type 2 diabetes mellitus with other specified complication, without long-term current use of insulin [facility])   [facility], Althea Charon, DO   1 year ago Annual physical exam   [facility], DO   2 years ago Type 2 diabetes mellitus with other specified complication, without long-term current use of insulin (HCC)   [facility], Althea Charon, DO      Future Appointments            In 2 months Netta Neat, Althea Charon, DO [facility], [facility]

## 2020-10-06 ENCOUNTER — Other Ambulatory Visit: Payer: Self-pay | Admitting: Family Medicine

## 2020-10-06 DIAGNOSIS — E1169 Type 2 diabetes mellitus with other specified complication: Secondary | ICD-10-CM

## 2020-10-31 ENCOUNTER — Other Ambulatory Visit: Payer: Self-pay | Admitting: Family Medicine

## 2020-10-31 DIAGNOSIS — E1169 Type 2 diabetes mellitus with other specified complication: Secondary | ICD-10-CM

## 2020-10-31 NOTE — Telephone Encounter (Signed)
Duplicate Refill requests for Metformin

## 2020-11-04 ENCOUNTER — Other Ambulatory Visit: Payer: PRIVATE HEALTH INSURANCE

## 2020-11-05 LAB — COMPLETE METABOLIC PANEL WITH GFR
AG Ratio: 1.7 (calc) (ref 1.0–2.5)
ALT: 26 U/L (ref 9–46)
AST: 14 U/L (ref 10–40)
Albumin: 4.1 g/dL (ref 3.6–5.1)
Alkaline phosphatase (APISO): 44 U/L (ref 36–130)
BUN: 14 mg/dL (ref 7–25)
CO2: 27 mmol/L (ref 20–32)
Calcium: 9.3 mg/dL (ref 8.6–10.3)
Chloride: 105 mmol/L (ref 98–110)
Creat: 0.74 mg/dL (ref 0.60–1.35)
GFR, Est African American: 127 mL/min/{1.73_m2} (ref >=60)
GFR, Est Non African American: 110 mL/min/{1.73_m2} (ref >=60)
Globulin: 2.4 g/dL (calc) (ref 1.9–3.7)
Glucose, Bld: 132 mg/dL — ABNORMAL HIGH (ref 65–99)
Potassium: 4.6 mmol/L (ref 3.5–5.3)
Sodium: 138 mmol/L (ref 135–146)
Total Bilirubin: 0.6 mg/dL (ref 0.2–1.2)
Total Protein: 6.5 g/dL (ref 6.1–8.1)

## 2020-11-05 LAB — LIPID PANEL
Cholesterol: 146 mg/dL (ref <200)
HDL: 49 mg/dL (ref >=40)
LDL Cholesterol (Calc): 79 mg/dL (calc)
Non-HDL Cholesterol (Calc): 97 mg/dL (calc) (ref <130)
Total CHOL/HDL Ratio: 3 (calc) (ref <5.0)
Triglycerides: 93 mg/dL (ref <150)

## 2020-11-05 LAB — CBC WITH DIFFERENTIAL/PLATELET
Absolute Monocytes: 541 cells/uL (ref 200–950)
Basophils Absolute: 52 cells/uL (ref 0–200)
Basophils Relative: 1 %
Eosinophils Absolute: 109 cells/uL (ref 15–500)
Eosinophils Relative: 2.1 %
HCT: 43.1 % (ref 38.5–50.0)
Hemoglobin: 14.6 g/dL (ref 13.2–17.1)
Lymphs Abs: 1659 cells/uL (ref 850–3900)
MCH: 30.5 pg (ref 27.0–33.0)
MCHC: 33.9 g/dL (ref 32.0–36.0)
MCV: 90.2 fL (ref 80.0–100.0)
MPV: 10.3 fL (ref 7.5–12.5)
Monocytes Relative: 10.4 %
Neutro Abs: 2839 cells/uL (ref 1500–7800)
Neutrophils Relative %: 54.6 %
Platelets: 317 10*3/uL (ref 140–400)
RBC: 4.78 10*6/uL (ref 4.20–5.80)
RDW: 12.7 % (ref 11.0–15.0)
Total Lymphocyte: 31.9 %
WBC: 5.2 10*3/uL (ref 3.8–10.8)

## 2020-11-05 LAB — PSA: PSA: 0.4 ng/mL (ref <=4.00)

## 2020-11-05 LAB — HEMOGLOBIN A1C
Hgb A1c MFr Bld: 6.7 % of total Hgb — ABNORMAL HIGH (ref <5.7)
Mean Plasma Glucose: 146 mg/dL
eAG (mmol/L): 8.1 mmol/L

## 2020-11-11 ENCOUNTER — Other Ambulatory Visit: Payer: Self-pay

## 2020-11-11 ENCOUNTER — Encounter: Payer: Self-pay | Admitting: Family Medicine

## 2020-11-11 ENCOUNTER — Ambulatory Visit (INDEPENDENT_AMBULATORY_CARE_PROVIDER_SITE_OTHER): Payer: PRIVATE HEALTH INSURANCE | Admitting: Family Medicine

## 2020-11-11 ENCOUNTER — Telehealth: Payer: Self-pay | Admitting: Family Medicine

## 2020-11-11 VITALS — BP 106/72 | HR 86 | Ht 75.0 in | Wt 294.2 lb

## 2020-11-11 DIAGNOSIS — I10 Essential (primary) hypertension: Secondary | ICD-10-CM

## 2020-11-11 DIAGNOSIS — L720 Epidermal cyst: Secondary | ICD-10-CM

## 2020-11-11 DIAGNOSIS — E1169 Type 2 diabetes mellitus with other specified complication: Secondary | ICD-10-CM

## 2020-11-11 DIAGNOSIS — Z0000 Encounter for general adult medical examination without abnormal findings: Secondary | ICD-10-CM | POA: Diagnosis not present

## 2020-11-11 DIAGNOSIS — Z125 Encounter for screening for malignant neoplasm of prostate: Secondary | ICD-10-CM

## 2020-11-11 DIAGNOSIS — E785 Hyperlipidemia, unspecified: Secondary | ICD-10-CM

## 2020-11-11 MED ORDER — OZEMPIC (2 MG/DOSE) 8 MG/3ML ~~LOC~~ SOPN: 2.0000 mg | PEN_INJECTOR | SUBCUTANEOUS | 3 refills | Status: DC

## 2020-11-11 MED ORDER — LOSARTAN POTASSIUM 50 MG PO TABS: 1.0000 | ORAL_TABLET | Freq: Every day | ORAL | 3 refills | Status: DC

## 2020-11-11 MED ORDER — PRAVASTATIN SODIUM 20 MG PO TABS: 20.0000 mg | ORAL_TABLET | Freq: Every day | ORAL | 3 refills | Status: DC

## 2020-11-11 NOTE — Assessment & Plan Note (Signed)
Controlled cholesterol on pravastatin - with lifestyle improvements Last lipid panel 10/2020, controlled  Plan: 1. Continue current meds - Pravastatin 20mg  daily 2. Encourage improved lifestyle - low carb/cholesterol, reduce portion size, continue improving regular exercise 3. Follow-up yearly lipids

## 2020-11-11 NOTE — Telephone Encounter (Signed)
Pt wife  Janthony, [place]     [phone]  Is calling back to speak to the nurse regarding her husbands hypertension meds. Please advise [phone]

## 2020-11-11 NOTE — Patient Instructions (Addendum)
Thank you for coming to the office today.  Referral to [facility] for cyst on scalp  Labs reviewed. Mostly normal, PSA negative. Sugar stable at A1c 6.7  Inc dose Ozempic to 40m. Other meds refilled   Colon Cancer Screening: - For all adults age 47+routine colon cancer screening is highly recommended.     - Recent guidelines from [place] starting age of 460- Early detection of colon cancer is important, because often there are no warning signs or symptoms, also if found early usually it can be cured. Late stage is hard to treat.  - If you are not interested in Colonoscopy screening (if done and normal you could be cleared for 5 to 10 years until next due), then Cologuard is an excellent alternative for screening test for Colon Cancer. It is highly sensitive for detecting DNA of colon cancer from even the earliest stages. Also, there is NO bowel prep required. - If Cologuard is NEGATIVE, then it is good for 3 years before next due - If Cologuard is POSITIVE, then it is strongly advised to get a Colonoscopy, which allows the GI doctor to locate the source of the cancer or polyp (even very early stage) and treat it by removing it. ------------------------- If you would like to proceed with Cologuard (stool DNA test) - FIRST, call your insurance company and tell them you want to check cost of Cologuard tell them CPT Code [phone](it may be completely covered and you could get for no cost, OR max cost without any coverage is about $600). Also, keep in mind if you do NOT open the kit, and decide not to do the test, you will NOT be charged, you should contact the company if you decide not to do the test. - If you want to proceed, you can notify [place]phone message, [facility] or at next visit) and we will order it for you. The test kit will be delivered to you house within about 1 week. Follow instructions to collect sample, you may call the company for any  help or questions, 24/7 telephone support at [phone]   Please schedule a Follow-up Appointment to: Return in about 6 months (around 05/13/2021) for 6 month follow-up DM A1c.  If you have any other questions or concerns, please feel free to call the office or send a message through [place] You may also schedule an earlier appointment if necessary.  Additionally, you may be receiving a survey about your experience at our office within a few days to 1 week by e-mail or mail. We value your feedback.  ANobie Putnam DO [facility]

## 2020-11-11 NOTE — Assessment & Plan Note (Signed)
Well-controlled HTN - low normal - Home BP readings normal  No known complications     Plan:  1. Continue Losartan 50mg  daily, previously had been lowered, now back on whole tab 2. Encourage improved lifestyle - low sodium diet, regular exercise 3. Continue monitor BP outside office, bring readings to next visit, if persistently >140/90 or new symptoms notify office sooner

## 2020-11-11 NOTE — Assessment & Plan Note (Addendum)
Controlled DM A1c at 6.7 increased now Overall improved on GLP1 top dose Ozempic and lifestyle improvements Complicated by HTN. HLD. Morbid obesity. Failed SGLT2 (jardiance - UTI), Actos  Plan:  1. INCREASE current dose Ozempic 1mg  up to 2mg  weekly [place] injection - new rx sent - Continue Metformin 1000mg  BID for now still, goal to taper down - Encouraged continue work on implementing healthy DM diet, limited portion, reviewed reduced carb plan, continue improve hydration, continue regular exercise, treadmill Continue ARB, statin DM Foot done today Need DM Eye updated

## 2020-11-11 NOTE — Progress Notes (Signed)
Subjective:    Patient ID: Colton Long, male    DOB: 1975/08/01, 45 y.o.   MRN: [id number]  Colton Long is a 46 y.o. male presenting on 11/11/2020 for Prediabetes, Hypertension, and Hyperlipidemia   HPI  Annual Physical and Lab Review.   CHRONIC DM, Type 2 / MORBID OBESITY BMI 36 Today reports doing very well. CBG - Avg sugars still similar to before 100-130s, Low >100 no symptoms in past 6 months, < 150 Meds: Ozempic 1mg  weekly [place] inj (Saturday), Metformin 1000mg  BID Reports good compliance. Tolerating well w/o side-effects Currently on ARB (Losartan) Lifestyle: Weight up in 6 months. doing well on ozempic - Improving walking regularly, not as much lately. - Improving carb control DM diet, following a similar ketogenic diet - Dr Ellin Mayhew DM Eye exam previous, will be due in 2022. - Denies hypoglycemia  CHRONIC HTN: Reports doing well without concern Current Meds - Losartan 50mg  daily   Reports good compliance, took meds today. Tolerating well, w/o complaints. Denies CP, dyspnea, HA, edema, dizziness / lightheadedness   HYPERLIPIDEMIA: - Reports no concerns. Last lipid 10/2020, well controlled - Currently taking Pravastatin 20mg  daily, tolerating well without side effects or myalgias   Vitamin D Deficiency: - Last lab result 24 in 2021, doing well overall. Not on supplement.   Chronic Anxiety and Intermittent Stress - Prior history of anxiety >15-20 years ago, treated with Paxil in past,  - Currently taking Fluoxetine 20mg  daily, had been on from his prior PCP for several years ago, with good results    Health Maintenance: - UTD Vaccines, including pneumonia vaccine, TDAp   Colon CA Screening: Never had colonoscopy. Currently asymptomatic. No known family history of colon CA. Due for screening test considering Cologuard, counseling given  Prostate CA Screening: PSA 0.40 10/2020. Currently asymptomatic without BPH LUTS. Known family history of prostate CA paternal  uncle x 2 prostate cancer treated.   Depression screen [facility] 2/9 11/11/2020 05/13/2020 11/25/2019  Decreased Interest 0 0 0  Down, Depressed, Hopeless 0 0 0  PHQ - 2 Score 0 0 0  Altered sleeping 0 - -  Tired, decreased energy 0 - -  Change in appetite 0 - -  Feeling bad or failure about yourself  0 - -  Trouble concentrating 0 - -  Moving slowly or fidgety/restless 0 - -  Suicidal thoughts 0 - -  PHQ-9 Score 0 - -  Difficult doing work/chores Not difficult at all - -    Past Medical History:  Diagnosis Date   Hypertension    Sleep apnea    Past Surgical History:  Procedure Laterality Date   CYSTECTOMY  2006,2011   head    NASAL SEPTUM SURGERY  2005   skin lesion exicision  2012   scalp Dr.[facility]   Social History   Socioeconomic History   Marital status: Married    Spouse name: Not on file   Number of children: Not on file   Years of education: Not on file   Highest education level: Not on file  Occupational History   Occupation: Engineer, structural  Tobacco Use   Smoking status: Never   Smokeless tobacco: Never  Vaping Use   Vaping Use: Never used  Substance and Sexual Activity   Alcohol use: Yes    Alcohol/week: 0.0 standard drinks    Comment: occasional   Drug use: No   Sexual activity: Not on file  Other Topics Concern   Not on file  Social History Narrative   Not on file   Social Determinants of Health   Financial Resource Strain: Not on file  Food Insecurity: Not on file  Transportation Needs: Not on file  Physical Activity: Not on file  Stress: Not on file  Social Connections: Not on file  Intimate Partner Violence: Not on file   Family History  Problem Relation Age of Onset   Hypertension Mother    Diabetes Mother    Cancer Father        lung   Heart disease Father    Hypertension Father    Diabetes Father    Prostate cancer Paternal Uncle    Colon cancer Neg Hx    Current Outpatient Medications on File Prior to Visit  Medication Sig    Blood Glucose Monitoring Suppl (ONE TOUCH ULTRA SYSTEM KIT) w/Device KIT 1 kit by Does not apply route once.   Cholecalciferol (VITAMIN D-3 PO) Take 2,000 Int'l Units by mouth daily.   FLUoxetine (PROZAC) 20 MG capsule TAKE 1 CAPSULE BY MOUTH EVERY DAY   fluticasone (FLONASE) 50 MCG/ACT nasal spray Place 2 sprays into both nostrils daily. Use for 4-6 weeks then stop and use seasonally or as needed.   glucose blood test strip 1 each by Other route as needed for other. Use as instructed   Insulin Pen Needle (INSUPEN PEN NEEDLES) 32G X 4 MM MISC Use with Ozempic pen inject weekly   metFORMIN (GLUCOPHAGE) 1000 MG tablet Take 1 tablet (1,000 mg total) by mouth 2 (two) times daily with a meal.   No current facility-administered medications on file prior to visit.    Review of Systems  Constitutional:  Negative for activity change, appetite change, chills, diaphoresis, fatigue and fever.  HENT:  Negative for congestion and hearing loss.   Eyes:  Negative for visual disturbance.  Respiratory:  Negative for cough, chest tightness, shortness of breath and wheezing.   Cardiovascular:  Negative for chest pain, palpitations and leg swelling.  Gastrointestinal:  Negative for abdominal pain, constipation, diarrhea, nausea and vomiting.  Genitourinary:  Negative for dysuria, frequency and hematuria.  Musculoskeletal:  Negative for arthralgias and neck pain.  Skin:  Negative for rash.  Allergic/Immunologic: Negative for environmental allergies.  Neurological:  Negative for dizziness, weakness, light-headedness, numbness and headaches.  Hematological:  Negative for adenopathy.  Psychiatric/Behavioral:  Negative for behavioral problems, dysphoric mood and sleep disturbance.   Per HPI unless specifically indicated above     Objective:    BP 106/72   Pulse 86   Ht $R'6\' 3"'ik$  (1.905 m)   Wt 294 lb 3.2 oz (133.4 kg)   SpO2 98%   BMI 36.77 kg/m   Wt Readings from Last 3 Encounters:  11/11/20 294 lb 3.2  oz (133.4 kg)  05/13/20 287 lb (130.2 kg)  11/25/19 (!) 304 lb 9.6 oz (138.2 kg)    Physical Exam Vitals and nursing note reviewed.  Constitutional:      General: He is not in acute distress.    Appearance: He is well-developed. He is not diaphoretic.     Comments: Well-appearing, comfortable, cooperative  HENT:     Head: Normocephalic and atraumatic.  Eyes:     General:        Right eye: No discharge.        Left eye: No discharge.     Conjunctiva/sclera: Conjunctivae normal.     Pupils: Pupils are equal, round, and reactive to light.  Neck:     Thyroid:  No thyromegaly.     Vascular: No carotid bruit.  Cardiovascular:     Rate and Rhythm: Normal rate and regular rhythm.     Pulses: Normal pulses.     Heart sounds: Normal heart sounds. No murmur heard. Pulmonary:     Effort: Pulmonary effort is normal. No respiratory distress.     Breath sounds: Normal breath sounds. No wheezing or rales.  Abdominal:     General: Bowel sounds are normal. There is no distension.     Palpations: Abdomen is soft. There is no mass.     Tenderness: There is no abdominal tenderness.  Musculoskeletal:        General: No tenderness. Normal range of motion.     Cervical back: Normal range of motion and neck supple.     Right lower leg: No edema.     Left lower leg: No edema.     Comments: Upper / Lower Extremities: - Normal muscle tone, strength bilateral upper extremities 5/5, lower extremities 5/5  Lymphadenopathy:     Cervical: No cervical adenopathy.  Skin:    General: Skin is warm and dry.     Findings: No erythema or rash.  Neurological:     Mental Status: He is alert and oriented to person, place, and time.     Comments: Distal sensation intact to light touch all extremities  Psychiatric:        Mood and Affect: Mood normal.        Behavior: Behavior normal.        Thought Content: Thought content normal.     Comments: Well groomed, good eye contact, normal speech and thoughts      Results for orders placed or performed in visit on 11/04/20  PSA  Result Value Ref Range   PSA 0.40 < OR = 4.00 ng/mL  Lipid panel  Result Value Ref Range   Cholesterol 146 <200 mg/dL   HDL 49 > OR = 40 mg/dL   Triglycerides 93 <150 mg/dL   LDL Cholesterol (Calc) 79 mg/dL (calc)   Total CHOL/HDL Ratio 3.0 <5.0 (calc)   Non-HDL Cholesterol (Calc) 97 <130 mg/dL (calc)  COMPLETE METABOLIC PANEL WITH GFR  Result Value Ref Range   Glucose, Bld 132 (H) 65 - 99 mg/dL   BUN 14 7 - 25 mg/dL   Creat 0.74 0.60 - 1.35 mg/dL   GFR, Est Non African American 110 > OR = 60 mL/min/1.93m2   GFR, Est African American 127 > OR = 60 mL/min/1.90m2   BUN/Creatinine Ratio NOT APPLICABLE 6 - 22 (calc)   Sodium 138 135 - 146 mmol/L   Potassium 4.6 3.5 - 5.3 mmol/L   Chloride 105 98 - 110 mmol/L   CO2 27 20 - 32 mmol/L   Calcium 9.3 8.6 - 10.3 mg/dL   Total Protein 6.5 6.1 - 8.1 g/dL   Albumin 4.1 3.6 - 5.1 g/dL   Globulin 2.4 1.9 - 3.7 g/dL (calc)   AG Ratio 1.7 1.0 - 2.5 (calc)   Total Bilirubin 0.6 0.2 - 1.2 mg/dL   Alkaline phosphatase (APISO) 44 36 - 130 U/L   AST 14 10 - 40 U/L   ALT 26 9 - 46 U/L  CBC with Differential/Platelet  Result Value Ref Range   WBC 5.2 3.8 - 10.8 Thousand/uL   RBC 4.78 4.20 - 5.80 Million/uL   Hemoglobin 14.6 13.2 - 17.1 g/dL   HCT 43.1 38.5 - 50.0 %   MCV 90.2 80.0 - 100.0  fL   MCH 30.5 27.0 - 33.0 pg   MCHC 33.9 32.0 - 36.0 g/dL   RDW 12.7 11.0 - 15.0 %   Platelets 317 140 - 400 Thousand/uL   MPV 10.3 7.5 - 12.5 fL   Neutro Abs 2,839 1,500 - 7,800 cells/uL   Lymphs Abs 1,659 850 - 3,900 cells/uL   Absolute Monocytes 541 200 - 950 cells/uL   Eosinophils Absolute 109 15 - 500 cells/uL   Basophils Absolute 52 0 - 200 cells/uL   Neutrophils Relative % 54.6 %   Total Lymphocyte 31.9 %   Monocytes Relative 10.4 %   Eosinophils Relative 2.1 %   Basophils Relative 1.0 %  Hemoglobin A1c  Result Value Ref Range   Hgb A1c MFr Bld 6.7 (H) <5.7 % of total  Hgb   Mean Plasma Glucose 146 mg/dL   eAG (mmol/L) 8.1 mmol/L      Assessment & Plan:   Problem List Items Addressed This Visit     Type 2 diabetes mellitus with other specified complication ([place])    Controlled DM A1c at 6.7 increased now Overall improved on GLP1 top dose Ozempic and lifestyle improvements Complicated by HTN. HLD. Morbid obesity. Failed SGLT2 (jardiance - UTI), Actos  Plan:  1. INCREASE current dose Ozempic 1mg  up to 2mg  weekly [place] injection - new rx sent - Continue Metformin 1000mg  BID for now still, goal to taper down - Encouraged continue work on implementing healthy DM diet, limited portion, reviewed reduced carb plan, continue improve hydration, continue regular exercise, treadmill Continue ARB, statin DM Foot done today Need DM Eye updated       Relevant Medications   OZEMPIC, 2 MG/DOSE, 8 MG/3ML SOPN   losartan (COZAAR) 50 MG tablet   pravastatin (PRAVACHOL) 20 MG tablet   Morbid obesity ([place])    Improved wt loss Inc dose GLP1       Relevant Medications   OZEMPIC, 2 MG/DOSE, 8 MG/3ML SOPN   Hyperlipidemia associated with type 2 diabetes mellitus ([place])    Controlled cholesterol on pravastatin - with lifestyle improvements Last lipid panel 10/2020, controlled  Plan: 1. Continue current meds - Pravastatin 20mg  daily 2. Encourage improved lifestyle - low carb/cholesterol, reduce portion size, continue improving regular exercise 3. Follow-up yearly lipids       Relevant Medications   OZEMPIC, 2 MG/DOSE, 8 MG/3ML SOPN   losartan (COZAAR) 50 MG tablet   pravastatin (PRAVACHOL) 20 MG tablet   Essential hypertension    Well-controlled HTN - low normal - Home BP readings normal  No known complications     Plan:  1. Continue Losartan 50mg  daily, previously had been lowered, now back on whole tab 2. Encourage improved lifestyle - low sodium diet, regular exercise 3. Continue monitor BP outside office, bring readings to next visit, if persistently  >140/90 or new symptoms notify office sooner       Relevant Medications   losartan (COZAAR) 50 MG tablet   pravastatin (PRAVACHOL) 20 MG tablet   Other Visit Diagnoses     Annual physical exam    -  Primary   Screening for prostate cancer       Epidermoid cyst of skin of scalp       Relevant Orders   Ambulatory referral to General Surgery       Updated Health Maintenance information PSA Negative 0.4  Due for routine colon cancer screening. Never had colonoscopy (not interested), no family history colon cancer. - Discussion today about  recommendations for either Colonoscopy or Cologuard screening, benefits and risks of screening, interested in Cologuard, understands that if positive then recommendation is for diagnostic colonoscopy to follow-up.  - Patient advised to contact insurance first to learn cost, will notify us when ready for [place] to order Cologuard  Reviewed recent lab results with patient Encouraged improvement to lifestyle with diet and exercise Goal of weight loss  #Epidermoid Cyst of Scalp X 2 lesions on scalp, one larger than other, no complication or infection, increasing size and discomfort, requesting excision, has had prior ones excised by gen surgery years ago. Referral to [facility] today.  Meds ordered this encounter  Medications   OZEMPIC, 2 MG/DOSE, 8 MG/3ML SOPN    Sig: Inject 2 mg into the skin once a week.    Dispense:  9 mL    Refill:  3    Dose change to $RemoveB'2mg'opISXAfh$  now. 3 month / 84 day   losartan (COZAAR) 50 MG tablet    Sig: Take 1 tablet (50 mg total) by mouth daily.    Dispense:  90 tablet    Refill:  3   pravastatin (PRAVACHOL) 20 MG tablet    Sig: Take 1 tablet (20 mg total) by mouth daily.    Dispense:  90 tablet    Refill:  3     Follow up plan: Return in about 6 months (around 05/13/2021) for 6 month follow-up DM A1c.  Nobie Putnam, [facility] 11/11/2020, 8:47 AM

## 2020-11-11 NOTE — Assessment & Plan Note (Signed)
Improved wt loss Inc dose GLP1

## 2020-11-11 NOTE — Telephone Encounter (Signed)
I called patient earlier did not reach him, I left voicemail and I spoke to his wife.  He was taking Losartan 50mg  in past and we lowered it by half tab to 25mg  dose. However since that last visit it looks like the chart has changed back to 50mg  daily, I re ordered the 50mg  daily today.  Can you clarify which dose he prefers to take half of the 50mg  tab (25mg ) dose or if he prefers 1 whole 50mg  tab daily?  , DO [facility] 11/11/2020, 12:05 PM

## 2020-11-18 ENCOUNTER — Ambulatory Visit: Payer: Self-pay | Admitting: Surgery

## 2020-11-20 ENCOUNTER — Other Ambulatory Visit: Payer: Self-pay

## 2020-11-20 ENCOUNTER — Encounter: Payer: Self-pay | Admitting: Surgery

## 2020-11-20 ENCOUNTER — Ambulatory Visit (INDEPENDENT_AMBULATORY_CARE_PROVIDER_SITE_OTHER): Payer: No Typology Code available for payment source | Admitting: Surgery

## 2020-11-20 VITALS — BP 146/88 | HR 100 | Temp 98.3°F | Ht 75.0 in | Wt 298.0 lb

## 2020-11-20 DIAGNOSIS — L723 Sebaceous cyst: Secondary | ICD-10-CM | POA: Diagnosis not present

## 2020-11-20 NOTE — Patient Instructions (Signed)
We will schedule you for excision of these cysts in office.  Epidermoid Cyst Removal Epidermoid cyst removal is a procedure to remove a fluid-filled sac that forms under your skin (epidermoid cyst). This type of cyst is filled with a thick, oily substance (keratin) that is secreted by your skin glands. Epidermoid cysts may also be called epidermal cysts, or keratin cysts. Normally, the skin secretes this pasty material through a gland or a hair follicle. However, when a skin gland or hairfollicle becomes blocked, an epidermoid cyst can form. You may need this procedure if you have an epidermal cyst that becomes large,uncomfortable, or inflamed. Tell a health care provider about: Any allergies you have. All medicines you are taking, including vitamins, herbs, eye drops, creams, and over-the-counter medicines. Any problems you or family members have had with anesthetic medicines. Any blood disorders you have. Any surgeries you have had. Any medical conditions you have now or have had. Whether you are pregnant or may be pregnant. What are the risks? Generally, this is a safe procedure. However, problems may occur, including: Recurrence of the cyst. Bleeding. Infection. Scarring. What happens before the procedure? Ask your health care provider about: Changing or stopping your regular medicines. This is especially important if you are taking diabetes medicines or blood thinners. Taking medicines such as aspirin and ibuprofen. These medicines can thin your blood. Do not take these medicines unless your health care provider tells you to take them. Taking over-the-counter medicines, vitamins, herbs, and supplements. If you have an inflamed or infected cyst, you may have to take antibiotic medicine before the cyst removal. Take your antibiotic as told by your health care provider. Do not stop taking the antibiotic even if you start to feel better. Take a shower on the morning of your procedure. Your  health care provider may ask you to use a germ-killing soap. What happens during the procedure?  You will be given a medicine to numb the area (local anesthetic). The skin around the cyst will be cleaned with a germ-killing solution. The health care provider will make a small incision in your skin over the cyst. The health care provider will separate the cyst from the surrounding tissues that are under your skin. If possible, the cyst will be removed undamaged (intact). If the cyst bursts (ruptures), it will be removed in pieces. After the cyst is removed, the health care provider will control any bleeding and close the incision with small stitches (sutures) and glue.  The procedure may vary among health care providers and hospitals. What happens after the procedure? If you are prescribed an antibiotic medicine or ointment, take or apply it as told by your health care provider. Do not stop using the antibiotic even if you start to feel better. Summary Epidermoid cyst removal is a procedure to remove a sac that has formed under your skin. You may need this procedure if you have an epidermoid cyst that becomes large, uncomfortable, or inflamed. The health care provider will make a small incision in your skin to remove the cyst. If you are prescribed an antibiotic medicine before the procedure, after the procedure, or both, use the antibiotic as told by your health care provider. Do not stop using the antibiotic even if you start to feel better. This information is not intended to replace advice given to you by your health care provider. Make sure you discuss any questions you have with your healthcare provider. Document Revised: 08/21/2019 Document Reviewed: 08/21/2019 Elsevier Patient Education  2022 [employer]  

## 2020-11-20 NOTE — Progress Notes (Signed)
11/20/2020  Reason for Visit: Scalp sebaceous cysts  Referring Provider:  Nobie Putnam, DO  History of Present Illness: Colton Long is a 45 y.o. male presenting for evaluation of sebaceous cyst of the scalp.  The patient reports that he has had multiple cysts on his scalp removed over the years.  He now presents with 2 cysts that are on the left side of the frontal scalp.  One he has had for about a year and a half and has been slowly enlarging in size and the other 1 is much smaller and newer.  Reports that the other 1 started small like this 1.  Denies any significant pain but does report irritation particularly when combing his hair or doing haircuts.  Denies any drainage, erythema, or induration of the skin.  Denies any fevers, chills.  Past Medical History: Past Medical History:  Diagnosis Date   Hypertension    Sleep apnea      Past Surgical History: Past Surgical History:  Procedure Laterality Date   CYSTECTOMY  2006,2011   head    NASAL SEPTUM SURGERY  2005   skin lesion exicision  2012   scalp [facility]    Home Medications: Prior to Admission medications   Medication Sig Start Date End Date Taking? Authorizing Provider  Blood Glucose Monitoring Suppl (ONE TOUCH ULTRA SYSTEM KIT) w/Device KIT 1 kit by Does not apply route once.   Yes [provider]  Cholecalciferol (VITAMIN D-3 PO) Take 2,000 Int'l Units by mouth daily.   Yes [provider]  FLUoxetine (PROZAC) 20 MG capsule TAKE 1 CAPSULE BY MOUTH EVERY DAY 06/24/20  Yes Karamalegos, Devonne Doughty, DO  fluticasone (FLONASE) 50 MCG/ACT nasal spray Place 2 sprays into both nostrils daily. Use for 4-6 weeks then stop and use seasonally or as needed. 12/28/16  Yes Karamalegos, [name] J, DO  glucose blood test strip 1 each by Other route as needed for other. Use as instructed   Yes [provider]  Insulin Pen Needle (INSUPEN PEN NEEDLES) 32G X 4 MM MISC Use with Ozempic pen inject  weekly 07/26/17  Yes Karamalegos, [name] J, DO  losartan (COZAAR) 50 MG tablet Take 1 tablet (50 mg total) by mouth daily. Patient taking differently: Take 1 tablet by mouth daily. Takes 1/2 tablet daily 11/11/20  Yes Karamalegos, Devonne Doughty, DO  metFORMIN (GLUCOPHAGE) 1000 MG tablet Take 1 tablet (1,000 mg total) by mouth 2 (two) times daily with a meal. 05/13/20  Yes Karamalegos, [name] J, DO  OZEMPIC, 2 MG/DOSE, 8 MG/3ML SOPN Inject 2 mg into the skin once a week. 11/11/20  Yes Karamalegos, Devonne Doughty, DO  pravastatin (PRAVACHOL) 20 MG tablet Take 1 tablet (20 mg total) by mouth daily. 11/11/20  Yes Olin Hauser, DO    Allergies: Allergies  Allergen Reactions   Other Rash    Steroid Cream    Social History:  reports that he has never smoked. He has never used smokeless tobacco. He reports current alcohol use. He reports that he does not use drugs.   Family History: Family History  Problem Relation Age of Onset   Hypertension Mother    Diabetes Mother    Cancer Father        lung   Heart disease Father    Hypertension Father    Diabetes Father    Prostate cancer Paternal Uncle    Colon cancer Neg Hx     Review of Systems: Review of Systems  Constitutional:  Negative for chills and fever.  Respiratory:  Negative for shortness of breath.   Cardiovascular:  Negative for chest pain.  Gastrointestinal:  Negative for abdominal pain, nausea and vomiting.  Skin:        Scalp sebaceous cysts   Physical Exam BP (!) 146/88   Pulse 100   Temp 98.3 F (36.8 C)   Ht 6' 3" (1.905 m)   Wt 298 lb (135.2 kg)   SpO2 96%   BMI 37.25 kg/m  CONSTITUTIONAL: No acute distress, well-nourished HEENT:  Normocephalic, atraumatic, extraocular motion intact. RESPIRATORY:  Normal respiratory effort without pathologic use of accessory muscles. CARDIOVASCULAR: Regular rhythm and rate. MUSCULOSKELETAL:  Normal muscle strength and tone in all four extremities.  No peripheral  edema or cyanosis. SKIN: Patient has 2 small sebaceous cysts in the left scalp.  One measures about between 10 and 15 mm and the other one measures about 5 to 7 mm.  There is no overlying skin erythema or induration.  The areas are soft, mobile, and nontender. NEUROLOGIC:  Motor and sensation is grossly normal.  Cranial nerves are grossly intact. PSYCH:  Alert and oriented to person, place and time. Affect is normal.  Laboratory Analysis: No results found for this or any previous visit (from the past 24 hour(s)).  Imaging: No results found.  Assessment and Plan: This is a 45 y.o. male with 2 sebaceous cysts of the scalp.  - Discussed with the patient that these indeed look like sebaceous cysts and we can proceed with office excisions.  The patient would prefer to do this on Friday so we will schedule him for 12/04/2020.  Reviewed with him the procedure at length including the risks of bleeding, infection, injury to surrounding structures, postop care.  He is willing to proceed.  Face-to-face time spent with the patient and care providers was 30 minutes, with more than 50% of the time spent counseling, educating, and coordinating care of the patient.     Melvyn Neth, [facility]

## 2020-12-04 ENCOUNTER — Other Ambulatory Visit: Payer: Self-pay | Admitting: Surgery

## 2020-12-04 ENCOUNTER — Other Ambulatory Visit: Payer: Self-pay

## 2020-12-04 ENCOUNTER — Encounter: Payer: Self-pay | Admitting: Surgery

## 2020-12-04 ENCOUNTER — Ambulatory Visit (INDEPENDENT_AMBULATORY_CARE_PROVIDER_SITE_OTHER): Payer: No Typology Code available for payment source | Admitting: Surgery

## 2020-12-04 VITALS — BP 117/79 | HR 91 | Temp 98.0°F | Ht 75.0 in | Wt 299.4 lb

## 2020-12-04 DIAGNOSIS — L7211 Pilar cyst: Secondary | ICD-10-CM | POA: Diagnosis not present

## 2020-12-04 DIAGNOSIS — L723 Sebaceous cyst: Secondary | ICD-10-CM

## 2020-12-04 NOTE — Progress Notes (Signed)
  Procedure Date:  12/04/2020  Pre-operative Diagnosis: Left scalp cysts x 2  Post-operative Diagnosis:  Left scalp cyst x 2  Procedure:  Excision of two left scalp cysts x2  Surgeon:  Howie Ill, MD  Anesthesia:  General endotracheal  Estimated Blood Loss:  10 ml  Specimens:  left scalp cyst x 2  Complications:  None  Indications for Procedure:  This is a 45 y.o. male with diagnosis of a symptomatic two left scalp cysts.  The patient wishes to have this excised. The risks of bleeding, abscess or infection, injury to surrounding structures, and need for further procedures were all discussed with the patient and he was willing to proceed.  Description of Procedure: The patient was correctly identified at bedside.  The patient was placed supine.  Appropriate time-outs were performed.  The patient's left scalp was prepped and draped in usual sterile fashion.  Local anesthetic was infused intradermally.  A 2 cm incision was made over the posterior left cyst, and scalpel was used to dissect down the skin and subcutaneous tissue.  Skin flaps were created sharply, and then the cyst was excised intact.  It was sent off to pathology.  The cavity was then irrigated and hemostasis was assured with manual pressure.  Then we proceeded to the anterior left scalp cyst.  A 1 cm incision was made over the cyst, and scalpel was used to dissect down the skin and subcutaneous tissue.  Skin flaps were created sharply, and then the cyst was excised intact.  It was sent off to pathology.  The cavity was then irrigated and hemostasis was assured with manual pressure.  The wounds were then closed in two layers using 3-0 Vicryl and 4-0 Monocryl.  The incisions were cleaned and sealed with DermaBond.  The patient tolerated the procedure well and all sharps were appropriately disposed of at the end of the case.   Howie Ill, MD

## 2020-12-04 NOTE — Patient Instructions (Addendum)
We have removed 2 Cysts  in our office today.  You have sutures under the skin that will dissolve and also dermabond (skin glue) on top of your skin which will come off on it's own in 10-14 days.  You may shower in 24 hours.  Avoid Strenuous activities that will make you sweat during the next 48 hours to avoid the glue coming off prematurely. Avoid activities that will place pressure to this area of the body for 1-2 weeks to avoid re-injury to incision site.  You may use Ibuprofen or Tylenol as needed for pain. We have given you an ice pack. You may use this to the area several times a day for the next few days for any aching.   Please see your follow-up appointment provided. We will see you back in office to make sure this area is healed and to review the final pathology. If you have any questions or concerns prior to this appointment, call our office and speak with a nurse.    Excision of Skin Cysts or Lesions Excision of a skin lesion refers to the removal of a section of skin by making small cuts (incisions) in the skin. This procedure may be done to remove a cancerous (malignant) or noncancerous (benign) growth on the skin. It is typically done to treat or prevent cancer or infection. It may also be done to improve cosmetic appearance. The procedure may be done to remove: Cancerous growths, such as basal cell carcinoma, squamous cell carcinoma, or melanoma. Noncancerous growths, such as a cyst or lipoma. Growths, such as moles or skin tags, which may be removed for cosmetic reasons.  Various excision or surgical techniques may be used depending on your condition, the location of the lesion, and your overall health. Tell a health care provider about: Any allergies you have. All medicines you are taking, including vitamins, herbs, eye drops, creams, and over-the-counter medicines. Any problems you or family members have had with anesthetic medicines. Any blood disorders you have. Any  surgeries you have had. Any medical conditions you have. Whether you are pregnant or may be pregnant. What are the risks? Generally, this is a safe procedure. However, problems may occur, including: Bleeding. Infection. Scarring. Recurrence of the cyst, lipoma, or cancer. Changes in skin sensation or appearance, such as discoloration or swelling. Reaction to the anesthetics. Allergic reaction to surgical materials or ointments. Damage to nerves, blood vessels, muscles, or other structures. Continued pain.  What happens before the procedure? Ask your health care provider about: Changing or stopping your regular medicines. This is especially important if you are taking diabetes medicines or blood thinners. Taking medicines such as aspirin and ibuprofen. These medicines can thin your blood. Do not take these medicines before your procedure if your health care provider instructs you not to. You may be asked to take certain medicines. You may be asked to stop smoking. You may have an exam or testing. Plan to have someone take you home after the procedure. Plan to have someone help you with activities during recovery. What happens during the procedure? To reduce your risk of infection: Your health care team will wash or sanitize their hands. Your skin will be washed with soap. You will be given a medicine to numb the area (local anesthetic). One of the following excision techniques will be performed. At the end of any of these procedures, antibiotic ointment will be applied as needed. Each of the following techniques may vary among health care providers and  hospitals. Complete Surgical Excision The area of skin that needs to be removed will be marked with a pen. Using a small scalpel or scissors, the surgeon will gently cut around and under the lesion until it is completely removed. The lesion will be placed in a fluid and sent to the lab for examination. If necessary, bleeding will be  controlled with a device that delivers heat (electrocautery). The edges of the wound may be stitched (sutured) together, and a bandage (dressing) will be applied. This procedure may be performed to treat a cancerous growth or a noncancerous cyst or lesion. What happens after the procedure? Return to your normal activities as told by your health care provider.

## 2020-12-12 NOTE — Progress Notes (Signed)
12/12/20  Pathology results reviewed and released to patient via MyChart.  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  Patient has follow up in office on 12/16/20  Henrene Dodge, MD

## 2020-12-16 ENCOUNTER — Encounter: Payer: Self-pay | Admitting: Surgery

## 2020-12-16 ENCOUNTER — Ambulatory Visit: Payer: No Typology Code available for payment source | Admitting: Surgery

## 2020-12-16 ENCOUNTER — Other Ambulatory Visit: Payer: Self-pay

## 2020-12-16 VITALS — BP 126/86 | HR 112 | Temp 98.3°F | Ht 75.0 in | Wt 293.8 lb

## 2020-12-16 DIAGNOSIS — Z09 Encounter for follow-up examination after completed treatment for conditions other than malignant neoplasm: Secondary | ICD-10-CM

## 2020-12-16 DIAGNOSIS — L723 Sebaceous cyst: Secondary | ICD-10-CM

## 2020-12-16 NOTE — Patient Instructions (Signed)
Please call our office if you have any questions or concerns.  

## 2020-12-16 NOTE — Progress Notes (Signed)
12/16/2020  HPI: KAYDENN MCLEAR is a 45 y.o. male s/p excision of two pilar cysts on 12/04/20.  Presents today for follow up.  Has been doing well, denies any pain, or issues with the incisions.  Pathology showed benign pilar cysts.  Vital signs: BP 126/86   Pulse (!) 112   Temp 98.3 F (36.8 C) (Oral)   Ht 6\' 3"  (1.905 m)   Wt 293 lb 12.8 oz (133.3 kg)   SpO2 98%   BMI 36.72 kg/m    Physical Exam: Constitutional: No acute distress Skin:  Left scalp incisions are healing well, with dermabond peeling off, without any tenderness, without any erythema or signs of infection.  Assessment/Plan: This is a 45 y.o. male s/p excision of two scalp cysts  --Patient is healing well, without complications. --Pathology reviewed with patient again. --Follow up as needed.   55, MD [facility]

## 2020-12-19 ENCOUNTER — Other Ambulatory Visit: Payer: Self-pay | Admitting: Family Medicine

## 2020-12-19 DIAGNOSIS — F419 Anxiety disorder, unspecified: Secondary | ICD-10-CM

## 2020-12-19 NOTE — Telephone Encounter (Signed)
Last RF #90 2 RF too soon

## 2021-02-03 ENCOUNTER — Telehealth: Payer: Self-pay | Admitting: Family Medicine

## 2021-02-03 NOTE — Telephone Encounter (Signed)
Wife calling to report the Ozempic is on backorder at [facility].  [facility] said they are expecting in Sat, but no guarantee.  Pt takes the Ozempic on Sat.  Wife has checked around at other pharmacies, ([facility], [facility]) and everywhere is backordered.  She would like to know if you have another med he can take instead or find at another pharmacy?  I called [facility], they have 1 box. Not sure if wife wants to do pick that up for him, or change to a different med.  Because they do not know if this drug will be available after this. Please advise.

## 2021-02-03 NOTE — Telephone Encounter (Signed)
After speaking with Dr. Kirtland Bouchard, he said that we can give a sample box of the medication (2mg )  that is good for about a month in between him getting the prescription. I just called and spoke with his wife, , and she is going to come by tomorrow after lunch and pick it up.

## 2021-02-19 NOTE — Telephone Encounter (Signed)
Copied from CRM [id number]. Topic: General - Other >> Feb 19, 2021 10:16 AM Gwenlyn Fudge wrote: Reason for CRM: Pts wife calling on behalf of pt. She states that pts medication, ozempic, they have not been able to fill at pharmacy due to it being on back order. She states that she was able to receive some from office last time and is requesting to have the same thing as the pt will take his last injection on 02/20/21. Please advise.

## 2021-02-19 NOTE — Telephone Encounter (Signed)
That's fine he can come by for another ozempic sample - he should try other pharmacy locations however - like other [facility] and if he finds one with rx they can transfer it or he may be able to pick it up there.  Hopefully back order may end soon we were told end of September.  Saralyn Pilar, DO [facility] 02/19/2021, 6:07 PM

## 2021-02-23 NOTE — Telephone Encounter (Signed)
Message left letting him know that he can come pick up a sample.

## 2021-03-19 ENCOUNTER — Telehealth: Payer: Self-pay | Admitting: Family Medicine

## 2021-03-19 NOTE — Telephone Encounter (Signed)
We still haven't gotten any samples of the 2mg  yet.. I have left a messaged for the rep yesterday.

## 2021-03-19 NOTE — Telephone Encounter (Signed)
Ok, thank you. Could you notify patient as well to keep him in the loop?  Saralyn Pilar, DO [facility] 03/19/2021, 12:26 PM

## 2021-03-19 NOTE — Telephone Encounter (Signed)
Patientis is out Medication OZEMPIC, 2 MG/DOSE, 8 MG/3ML , needs samples beause pharmacy is out also,

## 2021-03-22 NOTE — Telephone Encounter (Signed)
We received four samples in today and the patient is aware that they can come pick a sample box up.

## 2021-03-28 ENCOUNTER — Other Ambulatory Visit: Payer: Self-pay | Admitting: Family Medicine

## 2021-03-28 DIAGNOSIS — F419 Anxiety disorder, unspecified: Secondary | ICD-10-CM

## 2021-03-28 NOTE — Telephone Encounter (Signed)
Requested Prescriptions  Pending Prescriptions Disp Refills  . FLUoxetine (PROZAC) 20 MG capsule [Pharmacy Med Name: FLUOXETINE 20MG  CAPSULES] 90 capsule 0    Sig: TAKE 1 CAPSULE BY MOUTH EVERY DAY     Psychiatry:  Antidepressants - SSRI Passed - 03/28/2021 12:15 PM      Passed - Valid encounter within last 6 months    Recent Outpatient Visits          4 months ago Annual physical exam   [facility], [place], DO   10 months ago Type 2 diabetes mellitus with other specified complication, without long-term current use of insulin (HCC)   [facility], Althea Charon, DO   1 year ago Annual physical exam   [facility], DO   1 year ago Type 2 diabetes mellitus with other specified complication, without long-term current use of insulin [facility])   [facility], Althea Charon, DO   2 years ago Annual physical exam   [facility], DO      Future Appointments            In 1 month Smitty Cords, Althea Charon, DO [facility], [facility]           Refused Prescriptions Disp Refills  . FLUoxetine (PROZAC) 20 MG capsule [Pharmacy Med Name: FLUOXETINE 20MG  CAPSULES] 90 capsule     Sig: TAKE 1 CAPSULE BY MOUTH EVERY DAY     Psychiatry:  Antidepressants - SSRI Passed - 03/28/2021 12:15 PM      Passed - Valid encounter within last 6 months    Recent Outpatient Visits          4 months ago Annual physical exam   [facility] [place], [facility], DO   10 months ago Type 2 diabetes mellitus with other specified complication, without long-term current use of insulin (HCC)   [facility] Netta Neat, [facility], DO   1 year ago Annual physical exam   [facility] Net[place] Neat, DO   1 year ago Type 2 diabetes mellitus with other specified complication, without long-term  current use of insulin [facility])   [facility], [facility], DO   2 years ago Annual physical exam   [facility] Net[place] Neat, DO      Future Appointments            In 1 month [facility], Smitty Cords, DO [facility], [facility]           . FLUoxetine (PROZAC) 20 MG capsule [Pharmacy Med Name: FLUOXETINE 20MG  CAPSULES] 90 capsule     Sig: TAKE 1 CAPSULE BY MOUTH EVERY DAY     Psychiatry:  Antidepressants - SSRI Passed - 03/28/2021 12:15 PM      Passed - Valid encounter within last 6 months    Recent Outpatient Visits          4 months ago Annual physical exam   [facility], 03/30/2021, DO   10 months ago Type 2 diabetes mellitus with other specified complication, without long-term current use of insulin [facility])   [facility] Netta Neat, [facility], DO   1 year ago Annual physical exam   [facility] Althea Charon, DO   1 year ago Type 2 diabetes mellitus with other specified complication, without  long-term current use of insulin [facility])   [facility] Althea Charon, [place]at, DO   2 years ago Annual physical exam   [facility] Althea Charon [place], DO      Future Appointments            In 1 month Althea Charon, [place]at, DO [facility], [facility]

## 2021-03-28 NOTE — Telephone Encounter (Signed)
Requested Prescriptions  Pending Prescriptions Disp Refills  . FLUoxetine (PROZAC) 20 MG capsule [Pharmacy Med Name: FLUOXETINE 20MG  CAPSULES] 90 capsule 0    Sig: TAKE 1 CAPSULE BY MOUTH EVERY DAY     Psychiatry:  Antidepressants - SSRI Passed - 03/28/2021 12:15 PM      Passed - Valid encounter within last 6 months    Recent Outpatient Visits          4 months ago Annual physical exam   [facility], [place], DO   10 months ago Type 2 diabetes mellitus with other specified complication, without long-term current use of insulin (HCC)   [facility], Althea Charon, DO   1 year ago Annual physical exam   [facility], DO   1 year ago Type 2 diabetes mellitus with other specified complication, without long-term current use of insulin [facility])   [facility], Althea Charon, DO   2 years ago Annual physical exam   [facility], DO      Future Appointments            In 1 month Smitty Cords, Althea Charon, DO [facility], [facility]           . FLUoxetine (PROZAC) 20 MG capsule [Pharmacy Med Name: FLUOXETINE 20MG  CAPSULES] 90 capsule     Sig: TAKE 1 CAPSULE BY MOUTH EVERY DAY     Psychiatry:  Antidepressants - SSRI Passed - 03/28/2021 12:15 PM      Passed - Valid encounter within last 6 months    Recent Outpatient Visits          4 months ago Annual physical exam   [facility] [place], [facility], DO   10 months ago Type 2 diabetes mellitus with other specified complication, without long-term current use of insulin (HCC)   [facility] Netta Neat, [facility], DO   1 year ago Annual physical exam   [facility] Net[facility] Neat, DO   1 year ago Type 2 diabetes mellitus with other specified complication, without long-term current use of insulin [facility])   [facility], [facility], DO   2 years ago Annual physical exam   [facility] Netta Neat, DO      Future Appointments            In 1 month [facility], Smitty Cords, DO [facility], [facility]           . FLUoxetine (PROZAC) 20 MG capsule [Pharmacy Med Name: FLUOXETINE 20MG  CAPSULES] 90 capsule     Sig: TAKE 1 CAPSULE BY MOUTH EVERY DAY     Psychiatry:  Antidepressants - SSRI Passed - 03/28/2021 12:15 PM      Passed - Valid encounter within last 6 months    Recent Outpatient Visits          4 months ago Annual physical exam   [facility] [place], 03/30/2021, DO   10 months ago Type 2 diabetes mellitus with other specified complication, without long-term current use of insulin [facility])   [facility] Netta Neat, [facility], DO   1 year ago Annual physical exam   [facility] Althea Charon, DO   1 year ago Type 2 diabetes mellitus with other specified complication, without long-term current use of insulin (  HCC)   [facility] Althea Charon, [place] Neat, DO   2 years ago Annual physical exam   [facility] Althea Charon [place], DO      Future Appointments            In 1 month Althea Charon, [place] Neat, DO [facility], [facility]

## 2021-04-28 ENCOUNTER — Telehealth: Payer: Self-pay | Admitting: Family Medicine

## 2021-04-28 NOTE — Telephone Encounter (Signed)
Pt wife stated they are unable to get a refill for Ozempic from pharmacy x3 months. Wife requesting to pick up samples from office as pt is due for next injection Saturday.   Please advise.

## 2021-04-30 NOTE — Telephone Encounter (Signed)
His wife Clydie Braun is coming to pick up a sample today, (Friday).

## 2021-05-15 ENCOUNTER — Other Ambulatory Visit: Payer: Self-pay | Admitting: Family Medicine

## 2021-05-15 DIAGNOSIS — E1169 Type 2 diabetes mellitus with other specified complication: Secondary | ICD-10-CM

## 2021-05-16 NOTE — Telephone Encounter (Signed)
Requested medication (s) are due for refill today: yes  Requested medication (s) are on the active medication list: yes  Last refill:  05/13/20 #190 3 RF  Future visit scheduled: yes  Notes to clinic:  last Hgb A1c: 11/04/20  overdue lab work   Requested Prescriptions  Pending Prescriptions Disp Refills   metFORMIN (GLUCOPHAGE) 1000 MG tablet [Pharmacy Med Name: METFORMIN $RemoveBeforeD'1000MG'[id number]$  TABLETS] 180 tablet 3    Sig: TAKE 1 TABLET(1000 MG) BY MOUTH TWICE DAILY WITH A MEAL     Endocrinology:  Diabetes - Biguanides Failed - 05/15/2021  7:12 AM      Failed - HBA1C is between 0 and 7.9 and within 180 days    Hemoglobin A1C  Date Value Ref Range Status  12/27/2016 6.2  Final    Comment:    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 . For someone with known diabetes, a value <7% indicates  that their diabetes is well controlled and a value  greater than or equal to 7% indicates suboptimal  control. A1c targets should be individualized based on  duration of diabetes, age, comorbid conditions, and  other considerations. . Currently, no consensus exists regarding use of hemoglobin A1c for diagnosis of diabetes for children. .           Failed - Valid encounter within last 6 months    Recent Outpatient Visits           6 months ago Annual physical exam   [facility] Olin Hauser, DO   1 year ago Type 2 diabetes mellitus with other specified complication, without long-term current use of insulin ([place])   [place], Devonne Doughty, DO   1 year ago Annual physical exam   [facility] Olin Hauser, DO   1 year ago Type 2 diabetes mellitus  with other specified complication, without long-term current use of insulin ([place])   [facility], Devonne Doughty, DO   2 years ago Annual physical exam   [place], DO       Future Appointments             In 2 days [place], Devonne Doughty, DO [facility], Alpha in normal range and within 360 days    Creat  Date Value Ref Range Status  11/04/2020 0.74 0.60 - 1.35 mg/dL Final          Passed - eGFR in normal range and within 360 days    GFR, Est African American  Date Value Ref Range Status  11/04/2020 127 > OR = 60 mL/min/1.36m2 Final   GFR, Est Non African American  Date Value Ref Range Status  11/04/2020 110 > OR = 60 mL/min/1.49m2 Final

## 2021-05-18 ENCOUNTER — Ambulatory Visit (INDEPENDENT_AMBULATORY_CARE_PROVIDER_SITE_OTHER): Payer: No Typology Code available for payment source | Admitting: Family Medicine

## 2021-05-18 ENCOUNTER — Encounter: Payer: Self-pay | Admitting: Family Medicine

## 2021-05-18 ENCOUNTER — Other Ambulatory Visit: Payer: Self-pay

## 2021-05-18 VITALS — BP 114/75 | HR 87 | Ht 75.0 in | Wt 294.2 lb

## 2021-05-18 DIAGNOSIS — M7711 Lateral epicondylitis, right elbow: Secondary | ICD-10-CM | POA: Diagnosis not present

## 2021-05-18 DIAGNOSIS — F419 Anxiety disorder, unspecified: Secondary | ICD-10-CM

## 2021-05-18 DIAGNOSIS — E1169 Type 2 diabetes mellitus with other specified complication: Secondary | ICD-10-CM

## 2021-05-18 DIAGNOSIS — I10 Essential (primary) hypertension: Secondary | ICD-10-CM

## 2021-05-18 LAB — POCT GLYCOSYLATED HEMOGLOBIN (HGB A1C): Hemoglobin A1C: 6.4 % — AB (ref 4.0–5.6)

## 2021-05-18 MED ORDER — FLUOXETINE HCL 20 MG PO CAPS: 20.0000 mg | ORAL_CAPSULE | Freq: Every day | ORAL | 3 refills | Status: DC

## 2021-05-18 NOTE — Progress Notes (Signed)
Subjective:    Patient ID: Colton Long, male    DOB: 10/01/1975, 45 y.o.   MRN: [id number]  Colton Long is a 45 y.o. male presenting on 05/18/2021 for Diabetes   HPI  CHRONIC DM, Type 2 / MORBID OBESITY BMI 36 Today reports doing very well. A1c previously 6-7 range Today due for A1c POC CBG - Avg sugars still similar to before 100-130s, Low >100 no symptoms in past 6 months, < 150 Meds: Ozempic 2mg  weekly [place] inj (Saturday), Metformin 1000mg  BID Reports good compliance. Tolerating well w/o side-effects Currently on ARB (Losartan) Lifestyle: Weight down. - Improving walking regularly, not as much lately. - Improving carb control DM diet, following a similar ketogenic diet - Dr 04-26-2002 DM Eye exam previous, will be due in 2022. - Denies hypoglycemia   CHRONIC HTN: Reports doing well without concern Current Meds - Losartan 50mg  daily   Reports good compliance, took meds today. Tolerating well, w/o complaints. Denies CP, dyspnea, HA, edema, dizziness / lightheadedness   Chronic Anxiety and Intermittent Stress - Prior history of anxiety >15-20 years ago, treated with Paxil in past,  - Currently taking Fluoxetine 20mg  daily, had been on from his prior PCP for several years ago, with good results Refill due today  Additional issue  Right Tennis Elbow Lateral Epicondylitis Left handed, but uses for constant repetitive tasks and has developed R lateral elbow tendonitis pain. He has tried a forearm strap.      Health Maintenance: - UTD Vaccines, including pneumonia vaccine, TDAp   Depression screen [facility] 2/9 05/18/2021 11/11/2020 05/13/2020  Decreased Interest 0 0 0  Down, Depressed, Hopeless 0 0 0  PHQ - 2 Score 0 0 0  Altered sleeping 0 0 -  Tired, decreased energy 0 0 -  Change in appetite 0 0 -  Feeling bad or failure about yourself  0 0 -  Trouble concentrating 0 0 -  Moving slowly or fidgety/restless 0 0 -  Suicidal thoughts 0 0 -  PHQ-9 Score 0 0 -   Difficult doing work/chores Not difficult at all Not difficult at all -    Social History   Tobacco Use   Smoking status: Never   Smokeless tobacco: Never  Vaping Use   Vaping Use: Never used  Substance Use Topics   Alcohol use: Yes    Alcohol/week: 0.0 standard drinks    Comment: occasional   Drug use: No    Review of Systems Per HPI unless specifically indicated above     Objective:    BP 114/75    Pulse 87    Ht 6\' 3"  (1.905 m)    Wt 294 lb 3.2 oz (133.4 kg)    SpO2 99%    BMI 36.77 kg/m   Wt Readings from Last 3 Encounters:  05/18/21 294 lb 3.2 oz (133.4 kg)  12/16/20 293 lb 12.8 oz (133.3 kg)  12/04/20 299 lb 6.4 oz (135.8 kg)    Physical Exam Vitals and nursing note reviewed.  Constitutional:      General: He is not in acute distress.    Appearance: He is well-developed. He is not diaphoretic.     Comments: Well-appearing, comfortable, cooperative  HENT:     Head: Normocephalic and atraumatic.  Eyes:     General:        Right eye: No discharge.        Left eye: No discharge.     Conjunctiva/sclera: Conjunctivae normal.  Neck:  Thyroid: No thyromegaly.  Cardiovascular:     Rate and Rhythm: Normal rate and regular rhythm.     Pulses: Normal pulses.     Heart sounds: Normal heart sounds. No murmur heard. Pulmonary:     Effort: Pulmonary effort is normal. No respiratory distress.     Breath sounds: Normal breath sounds. No wheezing or rales.  Musculoskeletal:        General: Normal range of motion.     Cervical back: Normal range of motion and neck supple.     Right lower leg: No edema.     Left lower leg: No edema.  Lymphadenopathy:     Cervical: No cervical adenopathy.  Skin:    General: Skin is warm and dry.     Findings: No erythema or rash.  Neurological:     Mental Status: He is alert and oriented to person, place, and time. Mental status is at baseline.  Psychiatric:        Behavior: Behavior normal.     Comments: Well groomed, good eye  contact, normal speech and thoughts     Results for orders placed or performed in visit on 05/18/21  POCT HgB A1C  Result Value Ref Range   Hemoglobin A1C 6.4 (A) 4.0 - 5.6 %      Assessment & Plan:   Problem List Items Addressed This Visit     Type 2 diabetes mellitus with other specified complication (HCC) - Primary    Controlled DM A1c at 6.4 Overall improved on GLP1 top dose Ozempic and lifestyle improvements Complicated by HTN. HLD. Morbid obesity. Failed SGLT2 (jardiance - UTI), Actos  Plan:  1. Continue Ozempic 2mg . Sample if need. - Continue Metformin 1000mg  BID for now still, goal to taper down - Encouraged continue work on implementing healthy DM diet, limited portion, reviewed reduced carb plan, continue improve hydration, continue regular exercise, treadmill Continue ARB, statin [facility]      Relevant Orders   POCT HgB A1C (Completed)   Essential hypertension    Well-controlled HTN - low normal - Home BP readings normal  No known complications     Plan:  1. Continue Losartan 50mg  daily 2. Encourage improved lifestyle - low sodium diet, regular exercise 3. Continue monitor BP outside office, bring readings to next visit, if persistently >140/90 or new symptoms notify office sooner      Anxiety disorder    Stable, controlled On Fluoxetine 20mg  daily      Relevant Medications   FLUoxetine (PROZAC) 20 MG capsule   Other Visit Diagnoses     Right lateral epicondylitis           Clinically R forearm tendonitis, more specifically lateral epicondylitis given location and provoking factors on exam - Likely acute on chronic flare due to repetitive daily activity.  - Unlikely to be acute muscle tear or fracture  Plan - Reviewed precautions with diagnosis and goal to allow it to heal / modify activity START anti inflammatory topical - OTC Voltaren (generic Diclofenac) topical 2-4 times a day as needed for pain swelling of affected joint for 1-2 weeks or  longer. - Take Tylenol up to 1g TID PRN breakthrough - Recommend RICE therapy, emphasis on Forearm Strap to help support the muscle limit repetitive strain, may use ACE wrap for compression - Handout given on exercises at home   Meds ordered this encounter  Medications   FLUoxetine (PROZAC) 20 MG capsule    Sig: Take 1 capsule (20 mg  total) by mouth daily.    Dispense:  90 capsule    Refill:  3      Follow up plan: Return in about 6 months (around 11/16/2021) for 6 month fasting lab only then 1 week later Annual Physical.  Future labs due 6 months.  Saralyn Pilar, DO [facility] 05/18/2021, 8:15 AM

## 2021-05-18 NOTE — Patient Instructions (Addendum)
Thank you for coming to the office today.  Let me know if need any kind of Ozempic Sample coming up.  [facility] for yearly DM eye apt.  Recent Labs    11/04/20 0817 05/18/21 0815  HGBA1C 6.7* 6.4*   You most likely have "Tennis Elbow" or "Lateral Epicondylitis" - This is inflammation or tendonitis affecting the muscles of your forearm - Usually it is caused by frequent or daily repetitive activities using your arms, lifting, rotating, pulling, twisting - it can happen over days to months or longer from repetitive strain  One of the most important parts of treatment is rest and avoiding the activities that make it worse.  Recommend trial of Anti-inflammatory with - START anti inflammatory topical - OTC Voltaren (generic Diclofenac) topical 2-4 times a day as needed for pain swelling of affected joint for 1-2 weeks or longer.  - DO NOT TAKE any ibuprofen, aleve, motrin while you are taking this medicine - It is safe to take Tylenol Ext Str 500mg  tabs - take 1 to 2 (max dose 1000mg ) every 6 hours as needed for breakthrough pain, max 24 hour daily dose is 6 to 8 tablets or 4000mg   Use RICE therapy: - R - Rest / relative rest with activity modification avoid overuse of joint - I - Ice packs (make sure you use a towel or sock / something to protect skin) - C - Compression with ACE wrap to apply pressure and reduce swelling allowing more support  Try a Forearm Tennis Elbow Strap over muscle as demonstrated - use with repetitive activity to reduce strain of muscle    - E - Elevation - if significant swelling, lift leg above heart level (toes above your nose) to help reduce swelling, most helpful at night after day of being on your feet  May consider Physical Therapy referral if interested     DUE for FASTING BLOOD WORK (no food or drink after midnight before the lab appointment, only water or coffee without cream/sugar on the morning of)  SCHEDULE "Lab Only" visit in the morning at  the clinic for lab draw in 6 MONTHS   - Make sure Lab Only appointment is at about 1 week before your next appointment, so that results will be available  For Lab Results, once available within 2-3 days of blood draw, you can can log in to MyChart online to view your results and a brief explanation. Also, we can discuss results at next follow-up visit.   Please schedule a Follow-up Appointment to: Return in about 6 months (around 11/16/2021) for 6 month fasting lab only then 1 week later Annual Physical.  If you have any other questions or concerns, please feel free to call the office or send a message through MyChart. You may also schedule an earlier appointment if necessary.  Additionally, you may be receiving a survey about your experience at our office within a few days to 1 week by e-mail or mail. We value your feedback.  , DO [facility], [facility]               See other page with pictures of each exercise.  Start with 1 or 2 of these exercises that you are most comfortable with. Do not do any exercises that cause you significant worsening pain. Some of these may cause some "stretching soreness" but it should go away after you stop the exercise, and get better over time. Gradually increase up to 3-4 exercises  as tolerated.  You may do the stretching exercises right away. You may do the strengthening exercises when stretching is nearly painless.  Stretching exercises Wrist range of motion: Bend your wrist forward and backward as far as you can. Do 3 sets of 10. Pronation and supination of the forearm: With your elbow bent 90, turn your palm upward and hold for 5 seconds. Slowly turn your palm downward and hold for 5 seconds. Make sure you keep your elbow at your side and bent 90 throughout this exercise. Do 3 sets of 10. Elbow range of motion: Gently bring your palm up toward your shoulder and bend your elbow as far as you can. Then  straighten your elbow as far as you can 10 times. Do 3  sets of 10.  Strengthening exercises Wrist flexion exercise: Hold a can or hammer handle in your hand with your palm facing up. Bend your wrist upward. Slowly lower the weight and return to the starting position. Do 3 sets of 10. Gradually increase the weight of the can or weight you are holding. Wrist extension exercise: Hold a soup can or hammer handle in your hand with your palm facing down. Slowly bend your wrist upward. Slowly lower the weight down into the starting position. Do 3 sets of 10. Gradually increase the weight of the object you are holding. Wrist radial deviation strengthening: Put your wrist in the sideways position with your thumb up. Hold a can of soup or a hammer handle and gently bend your wrist up, with the thumb reaching toward the ceiling. Slowly lower to the starting position. Do not move your forearm throughout this exercise. Do 3 sets of 10. Forearm pronation and supination strengthening: Hold a soup can or hammer handle in your hand and bend your elbow 90. Slowly rotate your hand with your palm upward and then palm down. Do 3 sets of 10. Wrist extension (with broom handle): Stand up and hold a broom handle in both hands. With your arms at shoulder level, elbows straight and palms down, roll the broom handle backward in your hand as if you are reeling something in using a broom handle. Do 3 sets of 10.

## 2021-05-19 NOTE — Assessment & Plan Note (Signed)
Well-controlled HTN - low normal - Home BP readings normal  No known complications     Plan:  1. Continue Losartan 50mg daily 2. Encourage improved lifestyle - low sodium diet, regular exercise 3. Continue monitor BP outside office, bring readings to next visit, if persistently >140/90 or new symptoms notify office sooner 

## 2021-05-19 NOTE — Assessment & Plan Note (Signed)
Controlled DM A1c at 6.4 Overall improved on GLP1 top dose Ozempic and lifestyle improvements Complicated by HTN. HLD. Morbid obesity. Failed SGLT2 (jardiance - UTI), Actos  Plan:  1. Continue Ozempic 2mg . Sample if need. - Continue Metformin 1000mg  BID for now still, goal to taper down - Encouraged continue work on implementing healthy DM diet, limited portion, reviewed reduced carb plan, continue improve hydration, continue regular exercise, treadmill Continue ARB, statin [facility]

## 2021-05-19 NOTE — Assessment & Plan Note (Signed)
Stable, controlled On Fluoxetine 20mg daily 

## 2021-06-29 ENCOUNTER — Other Ambulatory Visit: Payer: Self-pay | Admitting: Family Medicine

## 2021-06-29 DIAGNOSIS — F419 Anxiety disorder, unspecified: Secondary | ICD-10-CM

## 2021-06-29 NOTE — Telephone Encounter (Signed)
Refilled 05/18/2021 #90 with 3 refills. Requested Prescriptions  Pending Prescriptions Disp Refills   FLUoxetine (PROZAC) 20 MG capsule [Pharmacy Med Name: FLUOXETINE 20MG  CAPSULES] 90 capsule 3    Sig: TAKE 1 CAPSULE BY MOUTH EVERY DAY     Psychiatry:  Antidepressants - SSRI Passed - 06/29/2021  3:15 AM      Passed - Valid encounter within last 6 months    Recent Outpatient Visits          1 month ago Type 2 diabetes mellitus with other specified complication, without long-term current use of insulin (HCC)   [facility], DO   7 months ago Annual physical exam   [facility] [place], [place], DO   1 year ago Type 2 diabetes mellitus with other specified complication, without long-term current use of insulin [facility])   [facility], Althea Charon, DO   1 year ago Annual physical exam   [facility], DO   2 years ago Type 2 diabetes mellitus with other specified complication, without long-term current use of insulin (HCC)   [facility], Althea Charon, DO      Future Appointments            In 4 months Netta Neat, Althea Charon, DO [facility], [facility]

## 2021-10-27 ENCOUNTER — Other Ambulatory Visit: Payer: Self-pay | Admitting: Family Medicine

## 2021-10-27 DIAGNOSIS — E1169 Type 2 diabetes mellitus with other specified complication: Secondary | ICD-10-CM

## 2021-10-27 NOTE — Telephone Encounter (Signed)
Requested Prescriptions  Pending Prescriptions Disp Refills  . pravastatin (PRAVACHOL) 20 MG tablet [Pharmacy Med Name: PRAVASTATIN 20MG  TABLETS] 90 tablet 0    Sig: TAKE 1 TABLET(20 MG) BY MOUTH DAILY     Cardiovascular:  Antilipid - Statins Failed - 10/27/2021  7:04 AM      Failed - Lipid Panel in normal range within the last 12 months    Cholesterol, Total  Date Value Ref Range Status  03/23/2015 175 100 - 199 mg/dL Final   Cholesterol  Date Value Ref Range Status  11/04/2020 146 <200 mg/dL Final   LDL Cholesterol (Calc)  Date Value Ref Range Status  11/04/2020 79 mg/dL (calc) Final    Comment:    Reference range: <100 . Desirable range <100 mg/dL for primary prevention;   <70 mg/dL for patients with CHD or diabetic patients  with > or = 2 CHD risk factors. 01/04/2021 LDL-C is now calculated using the Martin-Hopkins  calculation, which is a validated novel method providing  better accuracy than the Friedewald equation in the  estimation of LDL-C.  Marland Kitchen et al. Horald Pollen. Lenox Ahr): 2061-2068  (http://education.[employer].com/faq/FAQ164)    HDL  Date Value Ref Range Status  11/04/2020 49 > OR = 40 mg/dL Final  01/04/2021 45 95/28/4132 mg/dL Final    Comment:    According to ATP-III Guidelines, HDL-C >59 mg/dL is considered a negative risk factor for CHD.    Triglycerides  Date Value Ref Range Status  11/04/2020 93 <150 mg/dL Final         Passed - Patient is not pregnant      Passed - Valid encounter within last 12 months    Recent Outpatient Visits          5 months ago Type 2 diabetes mellitus with other specified complication, without long-term current use of insulin (HCC)   [facility], DO   11 months ago Annual physical exam   [facility], DO   1 year ago Type 2 diabetes mellitus with other specified complication, without long-term current use of insulin [facility])   [facility], DO   1 year ago Annual physical exam   [facility], DO   2 years ago Type 2 diabetes mellitus with other specified complication, without long-term current use of insulin (HCC)   [facility], Althea Charon, DO      Future Appointments            In 2 weeks Netta Neat, Althea Charon, DO [facility], [facility]

## 2021-11-04 ENCOUNTER — Other Ambulatory Visit: Payer: Self-pay

## 2021-11-04 DIAGNOSIS — Z125 Encounter for screening for malignant neoplasm of prostate: Secondary | ICD-10-CM

## 2021-11-04 DIAGNOSIS — E1169 Type 2 diabetes mellitus with other specified complication: Secondary | ICD-10-CM

## 2021-11-04 DIAGNOSIS — Z0000 Encounter for general adult medical examination without abnormal findings: Secondary | ICD-10-CM

## 2021-11-04 DIAGNOSIS — I10 Essential (primary) hypertension: Secondary | ICD-10-CM

## 2021-11-05 ENCOUNTER — Other Ambulatory Visit: Payer: No Typology Code available for payment source

## 2021-11-06 LAB — COMPREHENSIVE METABOLIC PANEL
AG Ratio: 1.9 (calc) (ref 1.0–2.5)
ALT: 44 U/L (ref 9–46)
AST: 27 U/L (ref 10–40)
Albumin: 4.3 g/dL (ref 3.6–5.1)
Alkaline phosphatase (APISO): 59 U/L (ref 36–130)
BUN: 15 mg/dL (ref 7–25)
CO2: 26 mmol/L (ref 20–32)
Calcium: 9.5 mg/dL (ref 8.6–10.3)
Chloride: 104 mmol/L (ref 98–110)
Creat: 0.81 mg/dL (ref 0.60–1.29)
Globulin: 2.3 g/dL (calc) (ref 1.9–3.7)
Glucose, Bld: 144 mg/dL — ABNORMAL HIGH (ref 65–99)
Potassium: 4.2 mmol/L (ref 3.5–5.3)
Sodium: 140 mmol/L (ref 135–146)
Total Bilirubin: 0.4 mg/dL (ref 0.2–1.2)
Total Protein: 6.6 g/dL (ref 6.1–8.1)

## 2021-11-06 LAB — LIPID PANEL
Cholesterol: 152 mg/dL (ref <200)
HDL: 51 mg/dL (ref >=40)
LDL Cholesterol (Calc): 78 mg/dL (calc)
Non-HDL Cholesterol (Calc): 101 mg/dL (calc) (ref <130)
Total CHOL/HDL Ratio: 3 (calc) (ref <5.0)
Triglycerides: 129 mg/dL (ref <150)

## 2021-11-06 LAB — CBC WITH DIFFERENTIAL/PLATELET
Absolute Monocytes: 519 cells/uL (ref 200–950)
Basophils Absolute: 51 cells/uL (ref 0–200)
Basophils Relative: 0.9 %
Eosinophils Absolute: 160 cells/uL (ref 15–500)
Eosinophils Relative: 2.8 %
HCT: 44.8 % (ref 38.5–50.0)
Hemoglobin: 15 g/dL (ref 13.2–17.1)
Lymphs Abs: 1778 cells/uL (ref 850–3900)
MCH: 30.7 pg (ref 27.0–33.0)
MCHC: 33.5 g/dL (ref 32.0–36.0)
MCV: 91.8 fL (ref 80.0–100.0)
MPV: 10.7 fL (ref 7.5–12.5)
Monocytes Relative: 9.1 %
Neutro Abs: 3192 cells/uL (ref 1500–7800)
Neutrophils Relative %: 56 %
Platelets: 295 10*3/uL (ref 140–400)
RBC: 4.88 10*6/uL (ref 4.20–5.80)
RDW: 12.7 % (ref 11.0–15.0)
Total Lymphocyte: 31.2 %
WBC: 5.7 10*3/uL (ref 3.8–10.8)

## 2021-11-06 LAB — HEMOGLOBIN A1C
Hgb A1c MFr Bld: 6.4 % of total Hgb — ABNORMAL HIGH (ref <5.7)
Mean Plasma Glucose: 137 mg/dL
eAG (mmol/L): 7.6 mmol/L

## 2021-11-06 LAB — PSA: PSA: 0.48 ng/mL (ref <=4.00)

## 2021-11-10 ENCOUNTER — Encounter: Payer: Self-pay | Admitting: Family Medicine

## 2021-11-10 ENCOUNTER — Ambulatory Visit (INDEPENDENT_AMBULATORY_CARE_PROVIDER_SITE_OTHER): Payer: No Typology Code available for payment source | Admitting: Family Medicine

## 2021-11-10 VITALS — BP 101/68 | HR 94 | Ht 75.0 in | Wt 292.4 lb

## 2021-11-10 DIAGNOSIS — I10 Essential (primary) hypertension: Secondary | ICD-10-CM | POA: Diagnosis not present

## 2021-11-10 DIAGNOSIS — E1169 Type 2 diabetes mellitus with other specified complication: Secondary | ICD-10-CM

## 2021-11-10 DIAGNOSIS — Z1211 Encounter for screening for malignant neoplasm of colon: Secondary | ICD-10-CM | POA: Diagnosis not present

## 2021-11-10 DIAGNOSIS — F419 Anxiety disorder, unspecified: Secondary | ICD-10-CM

## 2021-11-10 DIAGNOSIS — Z0000 Encounter for general adult medical examination without abnormal findings: Secondary | ICD-10-CM

## 2021-11-10 DIAGNOSIS — E785 Hyperlipidemia, unspecified: Secondary | ICD-10-CM

## 2021-11-10 MED ORDER — PRAVASTATIN SODIUM 20 MG PO TABS: 20.0000 mg | ORAL_TABLET | Freq: Every day | ORAL | 3 refills | Status: DC

## 2021-11-10 MED ORDER — LOSARTAN POTASSIUM 50 MG PO TABS: 50.0000 mg | ORAL_TABLET | Freq: Every day | ORAL | 3 refills | Status: DC

## 2021-11-10 MED ORDER — OZEMPIC (2 MG/DOSE) 8 MG/3ML ~~LOC~~ SOPN: 2.0000 mg | PEN_INJECTOR | SUBCUTANEOUS | 3 refills | Status: DC

## 2021-11-10 NOTE — Assessment & Plan Note (Signed)
Stable, controlled On Fluoxetine 20mg daily 

## 2021-11-10 NOTE — Assessment & Plan Note (Signed)
Well-controlled HTN - low normal - Home BP readings normal  No known complications     Plan:  1. Continue Losartan 50mg daily 2. Encourage improved lifestyle - low sodium diet, regular exercise 3. Continue monitor BP outside office, bring readings to next visit, if persistently >140/90 or new symptoms notify office sooner 

## 2021-11-10 NOTE — Assessment & Plan Note (Signed)
Comorbid factors BMI < 40 T2DM, HTN HLD On GLP1

## 2021-11-10 NOTE — Progress Notes (Signed)
Subjective:    Patient ID: Colton Long, male    DOB: November 25, 1975, 46 y.o.   MRN: [id number]  GERHART RUGGIERI is a 46 y.o. male presenting on 11/10/2021 for Annual Exam   HPI  Here for Annual Physical and Lab Review   CHRONIC DM, Type 2 / MORBID OBESITY BMI 36 Today reports doing very well. A1c previously 6-7 range CBG - Avg sugars still similar to before 100-130s, Low >100 no symptoms in past 6 months, < 150 Meds: Ozempic 13m weekly [place] inj (Saturday), Metformin 10030mBID Reports good compliance. Tolerating well w/o side-effects Currently on ARB (Losartan) Lifestyle: Weight down. - Improving walking regularly, not as much lately. - Improving carb control DM diet, following a similar ketogenic diet - Dr [facility], due this year. - Denies hypoglycemia   CHRONIC HTN: Reports doing well without concern Current Meds - Losartan 5069maily   Reports good compliance, took meds today. Tolerating well, w/o complaints. Denies CP, dyspnea, HA, edema, dizziness / lightheadedness   Chronic Anxiety and Intermittent Stress - Prior history of anxiety >15-20 years ago, treated with Paxil in past,  - Currently taking Fluoxetine 15m12mily, had been on from his prior PCP for several years ago, with good results Refill due today       Health Maintenance: - UTD Vaccines, including pneumonia vaccine, TDAp  Cologuard ordered. Pending coverage.  PSA 0.48, negative.     05/18/2021    8:24 AM 11/11/2020    8:31 AM 05/13/2020    9:13 AM  Depression screen PHQ 2/9  Decreased Interest 0 0 0  Down, Depressed, Hopeless 0 0 0  PHQ - 2 Score 0 0 0  Altered sleeping 0 0   Tired, decreased energy 0 0   Change in appetite 0 0   Feeling bad or failure about yourself  0 0   Trouble concentrating 0 0   Moving slowly or fidgety/restless 0 0   Suicidal thoughts 0 0   PHQ-9 Score 0 0   Difficult doing work/chores Not difficult at all Not difficult at all     Past Medical History:   Diagnosis Date   Hypertension    Sleep apnea    Past Surgical History:  Procedure Laterality Date   CYSTECTOMY  2006,2011   head    NASAL SEPTUM SURGERY  2005   skin lesion exicision  2012   scalp Dr[facility]   Social History   Socioeconomic History   Marital status: Married    Spouse name: Not on file   Number of children: Not on file   Years of education: Not on file   Highest education level: Not on file  Occupational History   Occupation: PoliEngineer, structuralb[employer] Use   Smoking status: Never   Smokeless tobacco: Never  Vaping Use   Vaping Use: Never used  Substance and Sexual Activity   Alcohol use: Yes    Alcohol/week: 0.0 standard drinks of alcohol    Comment: occasional   Drug use: No   Sexual activity: Not on file  Other Topics Concern   Not on file  Social History Narrative   Not on file   Social Determinants of Health   Financial Resource Strain: Not on file  Food Insecurity: Not on file  Transportation Needs: Not on file  Physical Activity: Not on file  Stress: Not on file  Social Connections: Not on file  Intimate Partner Violence: Not on file   Family  History  Problem Relation Age of Onset   Hypertension Mother    Diabetes Mother    Cancer Father        lung   Heart disease Father    Hypertension Father    Diabetes Father    Prostate cancer Paternal Uncle    Colon cancer Neg Hx    Current Outpatient Medications on File Prior to Visit  Medication Sig   Blood Glucose Monitoring Suppl (ONE TOUCH ULTRA SYSTEM KIT) w/Device KIT 1 kit by Does not apply route once.   Cholecalciferol (VITAMIN D-3 PO) Take 2,000 Int'l Units by mouth daily.   FLUoxetine (PROZAC) 20 MG capsule Take 1 capsule (20 mg total) by mouth daily.   fluticasone (FLONASE) 50 MCG/ACT nasal spray Place 2 sprays into both nostrils daily. Use for 4-6 weeks then stop and use seasonally or as needed.   glucose blood test strip 1 each by Other route as needed for other. Use as  instructed   Insulin Pen Needle (INSUPEN PEN NEEDLES) 32G X 4 MM MISC Use with Ozempic pen inject weekly   metFORMIN (GLUCOPHAGE) 1000 MG tablet TAKE 1 TABLET(1000 MG) BY MOUTH TWICE DAILY WITH A MEAL   No current facility-administered medications on file prior to visit.    Review of Systems  Constitutional:  Negative for activity change, appetite change, chills, diaphoresis, fatigue and fever.  HENT:  Negative for congestion and hearing loss.   Eyes:  Negative for visual disturbance.  Respiratory:  Negative for cough, chest tightness, shortness of breath and wheezing.   Cardiovascular:  Negative for chest pain, palpitations and leg swelling.  Gastrointestinal:  Negative for abdominal pain, constipation, diarrhea, nausea and vomiting.  Genitourinary:  Negative for dysuria, frequency and hematuria.  Musculoskeletal:  Negative for arthralgias and neck pain.  Skin:  Negative for rash.  Neurological:  Negative for dizziness, weakness, light-headedness, numbness and headaches.  Hematological:  Negative for adenopathy.  Psychiatric/Behavioral:  Negative for behavioral problems, dysphoric mood and sleep disturbance.    Per HPI unless specifically indicated above      Objective:    BP 101/68   Pulse 94   Ht _0  (1.905 m)   Wt 292 lb 6.4 oz (132.6 kg)   SpO2 98%   BMI 36.55 kg/m   Wt Readings from Last 3 Encounters:  11/10/21 292 lb 6.4 oz (132.6 kg)  05/18/21 294 lb 3.2 oz (133.4 kg)  12/16/20 293 lb 12.8 oz (133.3 kg)    Physical Exam Vitals and nursing note reviewed.  Constitutional:      General: He is not in acute distress.    Appearance: He is well-developed. He is obese. He is not diaphoretic.     Comments: Well-appearing, comfortable, cooperative  HENT:     Head: Normocephalic and atraumatic.  Eyes:     General:        Right eye: No discharge.        Left eye: No discharge.     Conjunctiva/sclera: Conjunctivae normal.     Pupils: Pupils are equal, round, and  reactive to light.  Neck:     Thyroid: No thyromegaly.     Vascular: No carotid bruit.  Cardiovascular:     Rate and Rhythm: Normal rate and regular rhythm.     Pulses: Normal pulses.     Heart sounds: Normal heart sounds. No murmur heard. Pulmonary:     Effort: Pulmonary effort is normal. No respiratory distress.     Breath sounds: Normal  breath sounds. No wheezing or rales.  Abdominal:     General: Bowel sounds are normal. There is no distension.     Palpations: Abdomen is soft. There is no mass.     Tenderness: There is no abdominal tenderness.  Musculoskeletal:        General: No tenderness. Normal range of motion.     Cervical back: Normal range of motion and neck supple.     Right lower leg: No edema.     Left lower leg: No edema.     Comments: Upper / Lower Extremities: - Normal muscle tone, strength bilateral upper extremities 5/5, lower extremities 5/5  Lymphadenopathy:     Cervical: No cervical adenopathy.  Skin:    General: Skin is warm and dry.     Findings: No erythema or rash.  Neurological:     Mental Status: He is alert and oriented to person, place, and time.     Comments: Distal sensation intact to light touch all extremities  Psychiatric:        Mood and Affect: Mood normal.        Behavior: Behavior normal.        Thought Content: Thought content normal.     Comments: Well groomed, good eye contact, normal speech and thoughts     Diabetic Foot Exam - Simple   Simple Foot Form Diabetic Foot exam was performed with the following findings: Yes 11/10/2021  9:03 AM  Visual Inspection See comments: Yes Sensation Testing Intact to touch and monofilament testing bilaterally: Yes Pulse Check Posterior Tibialis and Dorsalis pulse intact bilaterally: Yes Comments Dry skin dermatitis callus formation. No ulceration. Intact monofilament.      Results for orders placed or performed in visit on 11/04/21  PSA  Result Value Ref Range   PSA 0.48 < OR = 4.00  ng/mL  Lipid panel  Result Value Ref Range   Cholesterol 152 <200 mg/dL   HDL 51 > OR = 40 mg/dL   Triglycerides 129 <150 mg/dL   LDL Cholesterol (Calc) 78 mg/dL (calc)   Total CHOL/HDL Ratio 3.0 <5.0 (calc)   Non-HDL Cholesterol (Calc) 101 <130 mg/dL (calc)  Comprehensive Metabolic Panel (CMET)  Result Value Ref Range   Glucose, Bld 144 (H) 65 - 99 mg/dL   BUN 15 7 - 25 mg/dL   Creat 0.81 0.60 - 1.29 mg/dL   BUN/Creatinine Ratio NOT APPLICABLE 6 - 22 (calc)   Sodium 140 135 - 146 mmol/L   Potassium 4.2 3.5 - 5.3 mmol/L   Chloride 104 98 - 110 mmol/L   CO2 26 20 - 32 mmol/L   Calcium 9.5 8.6 - 10.3 mg/dL   Total Protein 6.6 6.1 - 8.1 g/dL   Albumin 4.3 3.6 - 5.1 g/dL   Globulin 2.3 1.9 - 3.7 g/dL (calc)   AG Ratio 1.9 1.0 - 2.5 (calc)   Total Bilirubin 0.4 0.2 - 1.2 mg/dL   Alkaline phosphatase (APISO) 59 36 - 130 U/L   AST 27 10 - 40 U/L   ALT 44 9 - 46 U/L  CBC with Differential  Result Value Ref Range   WBC 5.7 3.8 - 10.8 Thousand/uL   RBC 4.88 4.20 - 5.80 Million/uL   Hemoglobin 15.0 13.2 - 17.1 g/dL   HCT 44.8 38.5 - 50.0 %   MCV 91.8 80.0 - 100.0 fL   MCH 30.7 27.0 - 33.0 pg   MCHC 33.5 32.0 - 36.0 g/dL   RDW 12.7 11.0 - 15.0 %  Platelets 295 140 - 400 Thousand/uL   MPV 10.7 7.5 - 12.5 fL   Neutro Abs 3,192 1,500 - 7,800 cells/uL   Lymphs Abs 1,778 850 - 3,900 cells/uL   Absolute Monocytes 519 200 - 950 cells/uL   Eosinophils Absolute 160 15 - 500 cells/uL   Basophils Absolute 51 0 - 200 cells/uL   Neutrophils Relative % 56 %   Total Lymphocyte 31.2 %   Monocytes Relative 9.1 %   Eosinophils Relative 2.8 %   Basophils Relative 0.9 %  HgB A1c  Result Value Ref Range   Hgb A1c MFr Bld 6.4 (H) <5.7 % of total Hgb   Mean Plasma Glucose 137 mg/dL   eAG (mmol/L) 7.6 mmol/L      Assessment & Plan:   Problem List Items Addressed This Visit     Type 2 diabetes mellitus with other specified complication (HCC)    Controlled DM A1c at 6.4  Overall improved on  GLP1 top dose Ozempic and lifestyle improvements Complicated by HTN. HLD. Morbid obesity. Failed SGLT2 (jardiance - UTI), Actos  Plan:  1. Continue Ozempic 20m. - future consider Mounjaro GLP/GIP therapy as indicated if need further wt loss improvement - Continue Metformin 10059mBID for now still, goal to taper down - Encouraged continue work on implementing healthy DM diet, limited portion, reviewed reduced carb plan, continue improve hydration, continue regular exercise, treadmill Continue ARB, statin D[facility] reminder for [place] DM Foot Urine microalbumin      Relevant Medications   losartan (COZAAR) 50 MG tablet   OZEMPIC, 2 MG/DOSE, 8 MG/3ML SOPN   pravastatin (PRAVACHOL) 20 MG tablet   Other Relevant Orders   Urine Microalbumin w/creat. ratio   Morbid obesity (HCC)    Comorbid factors BMI < 40 T2DM, HTN HLD On GLP1      Relevant Medications   OZEMPIC, 2 MG/DOSE, 8 MG/3ML SOPN   Hyperlipidemia associated with type 2 diabetes mellitus (HCDelhi   Controlled cholesterol on pravastatin - with lifestyle improvements Last lipid panel 10/2021, controlled  Plan: 1. Continue current meds - Pravastatin 2060maily 2. Encourage improved lifestyle - low carb/cholesterol, reduce portion size, continue improving regular exercise 3. Follow-up yearly lipids      Relevant Medications   losartan (COZAAR) 50 MG tablet   OZEMPIC, 2 MG/DOSE, 8 MG/3ML SOPN   pravastatin (PRAVACHOL) 20 MG tablet   Essential hypertension    Well-controlled HTN - low normal - Home BP readings normal  No known complications     Plan:  1. Continue Losartan 67m52mily 2. Encourage improved lifestyle - low sodium diet, regular exercise 3. Continue monitor BP outside office, bring readings to next visit, if persistently >140/90 or new symptoms notify office sooner      Relevant Medications   losartan (COZAAR) 50 MG tablet   pravastatin (PRAVACHOL) 20 MG tablet   Anxiety disorder    Stable, controlled On  Fluoxetine 20mg17mly      Other Visit Diagnoses     Annual physical exam    -  Primary   Screening for colon cancer       Relevant Orders   Cologuard       Updated Health Maintenance information Cologuard ordered, he can check cost and submit if covered Reviewed recent lab results with patient Encouraged improvement to lifestyle with diet and exercise Goal of weight loss   Orders Placed This Encounter  Procedures   Cologuard   Urine Microalbumin w/creat. ratio  Meds ordered this encounter  Medications   losartan (COZAAR) 50 MG tablet    Sig: Take 1 tablet (50 mg total) by mouth daily.    Dispense:  90 tablet    Refill:  3   OZEMPIC, 2 MG/DOSE, 8 MG/3ML SOPN    Sig: Inject 2 mg into the skin once a week.    Dispense:  9 mL    Refill:  3    3 month / 84 day   pravastatin (PRAVACHOL) 20 MG tablet    Sig: Take 1 tablet (20 mg total) by mouth at bedtime.    Dispense:  90 tablet    Refill:  3      Follow up plan: Return in about 6 months (around 05/12/2022) for 6 month follow-up DM A1c.  Nobie Putnam, [facility] 11/10/2021, 8:48 AM

## 2021-11-10 NOTE — Patient Instructions (Addendum)
Thank you for coming to the office today.  [place] reminder  Urine today  Mounjaro - newest diabetes injectable agent. Let me know if interested in this, the top end doses on Mounjaro outperform the Ozempic top end dose that you are on. Increased wt loss and sugar control  ------------------------------------  Ordered the Cologuard (home kit) test for colon cancer screening. Stay tuned for further updates.  It will be shipped to you directly. If not received in 2-4 weeks, call us or the company.   If you send it back and no results are received in 2-4 weeks, call us or the company as well!   Colon Cancer Screening: - For all adults age 30+ routine colon cancer screening is highly recommended.     - Recent guidelines from [place] recommend starting age of 47 - Early detection of colon cancer is important, because often there are no warning signs or symptoms, also if found early usually it can be cured. Late stage is hard to treat.   - If Cologuard is NEGATIVE, then it is good for 3 years before next due - If Cologuard is POSITIVE, then it is strongly advised to get a Colonoscopy, which allows the GI doctor to locate the source of the cancer or polyp (even very early stage) and treat it by removing it. ------------------------- Follow instructions to collect sample, you may call the company for any help or questions, 24/7 telephone support at [phone].   Please schedule a Follow-up Appointment to: Return in about 6 months (around 05/12/2022) for 6 month follow-up DM A1c.  If you have any other questions or concerns, please feel free to call the office or send a message through [place]. You may also schedule an earlier appointment if necessary.  Additionally, you may be receiving a survey about your experience at our office within a few days to 1 week by e-mail or mail. We value your feedback.  Nobie Putnam, DO [place]

## 2021-11-10 NOTE — Assessment & Plan Note (Signed)
Controlled cholesterol on pravastatin - with lifestyle improvements Last lipid panel 10/2021, controlled  Plan: 1. Continue current meds - Pravastatin 20mg  daily 2. Encourage improved lifestyle - low carb/cholesterol, reduce portion size, continue improving regular exercise 3. Follow-up yearly lipids

## 2021-11-10 NOTE — Assessment & Plan Note (Addendum)
Controlled DM A1c at 6.4  Overall improved on GLP1 top dose Ozempic and lifestyle improvements Complicated by HTN. HLD. Morbid obesity. Failed SGLT2 (jardiance - UTI), Actos  Plan:  1. Continue Ozempic 2mg . - future consider Mounjaro GLP/GIP therapy as indicated if need further wt loss improvement - Continue Metformin 1000mg  BID for now still, goal to taper down - Encouraged continue work on implementing healthy DM diet, limited portion, reviewed reduced carb plan, continue improve hydration, continue regular exercise, treadmill Continue ARB, statin Dm Eye reminder for [facility] DM Foot Urine microalbumin

## 2021-11-11 LAB — MICROALBUMIN / CREATININE URINE RATIO
Creatinine, Urine: 251 mg/dL (ref 20–320)
Microalb Creat Ratio: 20 mcg/mg creat (ref <30)
Microalb, Ur: 5.1 mg/dL

## 2021-11-12 ENCOUNTER — Encounter: Payer: No Typology Code available for payment source | Admitting: Family Medicine

## 2021-12-26 ENCOUNTER — Other Ambulatory Visit: Payer: Self-pay | Admitting: Family Medicine

## 2021-12-26 DIAGNOSIS — I10 Essential (primary) hypertension: Secondary | ICD-10-CM

## 2021-12-28 NOTE — Telephone Encounter (Signed)
Requested medication (s) are due for refill today: no  Requested medication (s) are on the active medication list: yes  Last refill:  11/10/21  Future visit scheduled: yes  Notes to clinic:  Unable to refill per protocol, request is too soon. Last refill was 11/10/21 for 90 and 3 refills.     Requested Prescriptions  Pending Prescriptions Disp Refills   losartan (COZAAR) 50 MG tablet [Pharmacy Med Name: LOSARTAN 50MG  TABLETS] 90 tablet 3    Sig: TAKE 1 TABLET(50 MG) BY MOUTH DAILY     Cardiovascular:  Angiotensin Receptor Blockers Passed - 12/26/2021  9:49 AM      Passed - Cr in normal range and within 180 days    Creat  Date Value Ref Range Status  11/05/2021 0.81 0.60 - 1.29 mg/dL Final   Creatinine, Urine  Date Value Ref Range Status  11/10/2021 251 20 - 320 mg/dL Final         Passed - K in normal range and within 180 days    Potassium  Date Value Ref Range Status  11/05/2021 4.2 3.5 - 5.3 mmol/L Final         Passed - Patient is not pregnant      Passed - Last BP in normal range    BP Readings from Last 1 Encounters:  11/10/21 101/68         Passed - Valid encounter within last 6 months    Recent Outpatient Visits           1 month ago Annual physical exam   [facility], DO   7 months ago Type 2 diabetes mellitus with other specified complication, without long-term current use of insulin (HCC)   [facility], Althea Charon, DO   1 year ago Annual physical exam   [facility], DO   1 year ago Type 2 diabetes mellitus with other specified complication, without long-term current use of insulin [facility])   [facility], Althea Charon, DO   2 years ago Annual physical exam   [facility], DO       Future Appointments             In 4 months Smitty Cords, Althea Charon, DO [facility], [facility]

## 2022-02-02 ENCOUNTER — Other Ambulatory Visit: Payer: Self-pay

## 2022-02-02 DIAGNOSIS — E1169 Type 2 diabetes mellitus with other specified complication: Secondary | ICD-10-CM

## 2022-02-02 MED ORDER — OZEMPIC (2 MG/DOSE) 8 MG/3ML ~~LOC~~ SOPN: 2.0000 mg | PEN_INJECTOR | SUBCUTANEOUS | 3 refills | Status: DC

## 2022-02-15 LAB — HM DIABETES EYE EXAM

## 2022-02-26 ENCOUNTER — Other Ambulatory Visit: Payer: Self-pay | Admitting: Family Medicine

## 2022-02-26 DIAGNOSIS — E1169 Type 2 diabetes mellitus with other specified complication: Secondary | ICD-10-CM

## 2022-02-28 NOTE — Telephone Encounter (Signed)
Refilled 11/10/2021 #90 3 rf - confirmed by same pharmacy. Requested Prescriptions  Pending Prescriptions Disp Refills  . pravastatin (PRAVACHOL) 20 MG tablet [Pharmacy Med Name: PRAVASTATIN 20MG  TABLETS] 90 tablet 3    Sig: TAKE 1 TABLET(20 MG) BY MOUTH DAILY     Cardiovascular:  Antilipid - Statins Failed - 02/26/2022  3:18 AM      Failed - Lipid Panel in normal range within the last 12 months    Cholesterol, Total  Date Value Ref Range Status  03/23/2015 175 100 - 199 mg/dL Final   Cholesterol  Date Value Ref Range Status  11/05/2021 152 <200 mg/dL Final   LDL Cholesterol (Calc)  Date Value Ref Range Status  11/05/2021 78 mg/dL (calc) Final    Comment:    Reference range: <100 . Desirable range <100 mg/dL for primary prevention;   <70 mg/dL for patients with CHD or diabetic patients  with > or = 2 CHD risk factors. Marland Kitchen LDL-C is now calculated using the Martin-Hopkins  calculation, which is a validated novel method providing  better accuracy than the Friedewald equation in the  estimation of LDL-C.  Cresenciano Genre et al. Annamaria Helling. 0272;536(64): 2061-2068  (http://education.[employer].com/faq/FAQ164)    HDL  Date Value Ref Range Status  11/05/2021 51 > OR = 40 mg/dL Final  03/23/2015 45 >39 mg/dL Final    Comment:    According to ATP-III Guidelines, HDL-C >59 mg/dL is considered a negative risk factor for CHD.    Triglycerides  Date Value Ref Range Status  11/05/2021 129 <150 mg/dL Final         Passed - Patient is not pregnant      Passed - Valid encounter within last 12 months    Recent Outpatient Visits          3 months ago Annual physical exam   [facility] Olin Hauser, DO   9 months ago Type 2 diabetes mellitus with other specified complication, without long-term current use of insulin ([place])   [place], DO   1 year ago Annual physical exam   [facility] Olin Hauser, DO   1 year ago Type 2 diabetes mellitus with other specified complication, without long-term current use of insulin [facility])   [facility] Olin Hauser, DO   2 years ago Annual physical exam   [place], DO      Future Appointments            In 2 months [place] Ranger, Devonne Doughty, [facility], [facility]

## 2022-03-01 ENCOUNTER — Telehealth: Payer: Self-pay | Admitting: Family Medicine

## 2022-03-01 ENCOUNTER — Encounter: Payer: Self-pay | Admitting: Family Medicine

## 2022-03-01 NOTE — Telephone Encounter (Signed)
Called patient and spoke to his wife  I offered an Ozempic 2mg  weekly injection sample box (gold box, not red box) for 1 month free sample of 2mg .  They can come by to pick it up Wednesday 03/02/22.  I will put patient's name on a sticky note on the box in the [place] [employer] 03/01/2022, 4:56 PM

## 2022-03-01 NOTE — Telephone Encounter (Signed)
This encounter was created in error - please disregard.

## 2022-03-01 NOTE — Telephone Encounter (Addendum)
Medication Refill - Medication: Ozempic 2.5  Has the patient contacted their pharmacy? yes (Agent: If no, request that the patient contact the pharmacy for the refill. If patient does not wish to contact the pharmacy document the reason why and proceed with request.) (Agent: If yes, when and what did the pharmacy advise?)Patient's wife called in states med, Ozempic 2.5 is on backorder at  [employer], and is looking for help on how to get this, and there are going out of town Thursday  Preferred Pharmacy (with phone number or street name):  [facility], [place] - [address] AT [place] Phone:  [phone]  Fax:  [phone]     Has the patient been seen for an appointment in the last year OR does the patient have an upcoming appointment? yes  Agent: Please be advised that RX refills may take up to 3 business days. We ask that you follow-up with your pharmacy.

## 2022-03-01 NOTE — Telephone Encounter (Signed)
Pt has called back (wife) cking on this, not clear in note but she is wanting to come by the office and pick up office supply and not send to another pharmacy. Pls advise if this can be done. FU [place] [phone]. Just wants supply from office instead.

## 2022-03-01 NOTE — Telephone Encounter (Deleted)
Medication Refill - Medication: Ozemic 2.5  Has the patient contacted their pharmacy? yes (Agent: If no, request that the patient contact the pharmacy for the refill. If patient does not wish to contact the pharmacy document the reason why and proceed with request.) (Agent: If yes, when and what did the pharmacy advise?)Med is on backorder until Oct 27 is looking for another way to get this, they are leaving town Thursday  Preferred Pharmacy (with phone number or street name):  [facility], [place] - [address] AT [address] Phone:  [phone]  Fax:  [phone]     Has the patient been seen for an appointment in the last year OR does the patient have an upcoming appointment? yes  Agent: Please be advised that RX refills may take up to 3 business days. We ask that you follow-up with your pharmacy.

## 2022-03-02 NOTE — Telephone Encounter (Signed)
She came in today and got the sample.

## 2022-03-11 ENCOUNTER — Ambulatory Visit (INDEPENDENT_AMBULATORY_CARE_PROVIDER_SITE_OTHER): Payer: No Typology Code available for payment source | Admitting: Physician Assistant

## 2022-03-11 ENCOUNTER — Encounter: Payer: Self-pay | Admitting: Physician Assistant

## 2022-03-11 VITALS — BP 101/75 | HR 88 | Temp 96.2°F | Wt 291.0 lb

## 2022-03-11 DIAGNOSIS — L02415 Cutaneous abscess of right lower limb: Secondary | ICD-10-CM | POA: Diagnosis not present

## 2022-03-11 DIAGNOSIS — Z23 Encounter for immunization: Secondary | ICD-10-CM

## 2022-03-11 MED ORDER — SULFAMETHOXAZOLE-TRIMETHOPRIM 800-160 MG PO TABS: 1.0000 | ORAL_TABLET | Freq: Two times a day (BID) | ORAL | 0 refills | Status: AC

## 2022-03-11 NOTE — Progress Notes (Signed)
Acute Office Visit   Patient: Colton Long   DOB: 04-09-1976   46 y.o. Male  MRN: [id number] Visit Date: 03/11/2022  Today's healthcare provider: Dani Gobble Tameyah Koch, PA-C  Introduced myself to the patient as a Journalist, newspaper and provided education on APPs in clinical practice.    Chief Complaint  Patient presents with   Mass    Bump on back of right thigh that is getting bigger and hurting    Subjective    HPI HPI     Mass    Additional comments: Bump on back of right thigh that is getting bigger and hurting       Last edited by Kerrianne Jeng, Dani Gobble, PA-C on 03/11/2022  9:34 AM.        Reports he has had a bump on the back of his right thigh since Sunday States he thought it was a pimple or spider bite  Has been keeping a bandaid over it but that seemed to make it worse Reports it is painful and tender and seems to be getting bigger.  He states his wife put peroxide on it and some kind of ointment for itching but this did not seem to help much   Medications: Outpatient Medications Prior to Visit  Medication Sig   Blood Glucose Monitoring Suppl (ONE TOUCH ULTRA SYSTEM KIT) w/Device KIT 1 kit by Does not apply route once.   Cholecalciferol (VITAMIN D-3 PO) Take 2,000 Int'l Units by mouth daily.   FLUoxetine (PROZAC) 20 MG capsule Take 1 capsule (20 mg total) by mouth daily.   fluticasone (FLONASE) 50 MCG/ACT nasal spray Place 2 sprays into both nostrils daily. Use for 4-6 weeks then stop and use seasonally or as needed.   glucose blood test strip 1 each by Other route as needed for other. Use as instructed   Insulin Pen Needle (INSUPEN PEN NEEDLES) 32G X 4 MM MISC Use with Ozempic pen inject weekly   losartan (COZAAR) 50 MG tablet TAKE 1 TABLET(50 MG) BY MOUTH DAILY   metFORMIN (GLUCOPHAGE) 1000 MG tablet TAKE 1 TABLET(1000 MG) BY MOUTH TWICE DAILY WITH A MEAL   OZEMPIC, 2 MG/DOSE, 8 MG/3ML SOPN Inject 2 mg into the skin once a week.   pravastatin (PRAVACHOL) 20 MG tablet Take 1  tablet (20 mg total) by mouth at bedtime.   No facility-administered medications prior to visit.    Review of Systems  Skin:        Mass/ lump on posterior right thigh        Objective    BP 101/75 (BP Location: Right Arm, Patient Position: Sitting, Cuff Size: Large)   Pulse 88   Temp (!) 96.2 F (35.7 C) (Temporal)   Wt 291 lb (132 kg)   SpO2 99%   BMI 36.37 kg/m    Physical Exam Vitals reviewed.  Constitutional:      General: He is awake.     Appearance: Normal appearance. He is well-developed and well-groomed.  Pulmonary:     Effort: Pulmonary effort is normal.  Musculoskeletal:     Cervical back: Normal range of motion.  Skin:    General: Skin is warm.     Findings: Abscess present.       Neurological:     Mental Status: He is alert.  Psychiatric:        Behavior: Behavior is cooperative.       No results found for any visits on  03/11/22.  Assessment & Plan      No follow-ups on file.       Problem List Items Addressed This Visit   None Visit Diagnoses     Abscess of right thigh    -  Primary Acute, new concern Reports bump has been present since Sunday and is not improving with home care measures PE was concerning for indurated abscess -reviewed I&D with patient but I do not think this is indicated today Will rx Bactrim PO BID x 7 days with goal of resolution Recommend returning to office if he notices coalescing that is more amenable to I&D or if there is progression If symptoms improve and resolve, not need for return to office Reviewed with patient and he voiced understanding and agreement Follow up as needed.     Relevant Medications   sulfamethoxazole-trimethoprim (BACTRIM DS) 800-160 MG tablet   Need for influenza vaccination       Relevant Orders   Flu Vaccine QUAD 6+ mos PF IM (Fluarix Quad PF)        No follow-ups on file.   I, Jeany Seville E Nayellie Sanseverino, PA-C, have reviewed all documentation for this visit. The documentation on  03/11/22 for the exam, diagnosis, procedures, and orders are all accurate and complete.   Talitha Givens, MHS, PA-C [facility]

## 2022-03-15 ENCOUNTER — Other Ambulatory Visit: Payer: Self-pay

## 2022-03-15 ENCOUNTER — Emergency Department
Admission: EM | Admit: 2022-03-15 | Discharge: 2022-03-15 | Disposition: A | Discharge status: Home / Self Care | Payer: No Typology Code available for payment source | Attending: Emergency Medicine | Admitting: Emergency Medicine

## 2022-03-15 ENCOUNTER — Encounter: Payer: Self-pay | Admitting: Emergency Medicine

## 2022-03-15 DIAGNOSIS — W57XXXA Bitten or stung by nonvenomous insect and other nonvenomous arthropods, initial encounter: Secondary | ICD-10-CM

## 2022-03-15 DIAGNOSIS — L02414 Cutaneous abscess of left upper limb: Secondary | ICD-10-CM | POA: Insufficient documentation

## 2022-03-15 DIAGNOSIS — L0291 Cutaneous abscess, unspecified: Secondary | ICD-10-CM

## 2022-03-15 MED ORDER — OXYCODONE HCL 5 MG PO TABS: 5.0000 mg | ORAL_TABLET | Freq: Four times a day (QID) | ORAL | 0 refills | Status: AC | PRN

## 2022-03-15 MED ORDER — LIDOCAINE-EPINEPHRINE 1 %-1:100000 IJ SOLN
10.0000 mL | Freq: Once | INTRAMUSCULAR | Status: AC
  Administered 2022-03-15: 10 mL
  Filled 2022-03-15: qty 1

## 2022-03-15 MED ORDER — CEPHALEXIN 500 MG PO CAPS: 500.0000 mg | ORAL_CAPSULE | Freq: Two times a day (BID) | ORAL | 0 refills | Status: AC

## 2022-03-15 MED ORDER — SULFAMETHOXAZOLE-TRIMETHOPRIM 800-160 MG PO TABS: 1.0000 | ORAL_TABLET | Freq: Two times a day (BID) | ORAL | 0 refills | Status: AC

## 2022-03-15 NOTE — ED Triage Notes (Signed)
Pt presents to ED with complaints of possible spider bite to the back of R thight area. Pt states bite happened 1 week ago. Pt states taking ABX at this time.   Pt denies fevers or chills.

## 2022-03-15 NOTE — Discharge Instructions (Addendum)
Take the antibiotics to help with infection.  Use the oxycodone for pain.  Return to the ER develop fevers, worsening spreading of the discolored area or the redness or any other concerns Follow-up for wound check in 1 to 2 days  Take oxycodone as prescribed. Do not drink alcohol, drive or participate in any other potentially dangerous activities while taking this medication as it may make you sleepy. Do not take this medication with any other sedating medications, either prescription or over-the-counter. If you were prescribed Percocet or Vicodin, do not take these with acetaminophen (Tylenol) as it is already contained within these medications.  This medication is an opiate (or narcotic) pain medication and can be habit forming. Use it as little as possible to achieve adequate pain control. Do not use or use it with extreme caution if you have a history of opiate abuse or dependence. If you are on a pain contract with your primary care doctor or a pain specialist, be sure to let them know you were prescribed this medication today from the Emergency Department. This medication is intended for your use only - do not give any to anyone else and keep it in a secure place where nobody else, especially children, have access to it.

## 2022-03-15 NOTE — ED Provider Notes (Signed)
[facility] Provider Note    Event Date/Time   First MD Initiated Contact with Patient 03/15/22 1053     (approximate)   History   Insect Bite   HPI  Colton Long is a 46 y.o. male otherwise healthy comes in with right thigh spider bite.  Patient is unsure exactly what bit him but he thought it might have been a spider.  The bite happened 1 week ago was put on antibiotics 5 days ago.  He denies getting any better.  He denies any fevers chills or other symptoms he just reporting increasing pain.  Physical Exam   Triage Vital Signs: ED Triage Vitals [03/15/22 0930]  Enc Vitals Group     BP 132/81     Pulse Rate 81     Resp 18     Temp 98.1 F (36.7 C)     Temp Source Oral     SpO2 99 %     Weight      Height      Head Circumference      Peak Flow      Pain Score 7     Pain Loc      Pain Edu?      Excl. in [place]?     Most recent vital signs: Vitals:   03/15/22 0930  BP: 132/81  Pulse: 81  Resp: 18  Temp: 98.1 F (36.7 C)  SpO2: 99%     General: Awake, no distress.  CV:  Good peripheral perfusion.  Resp:  Normal effort.  Abd:  No distention.  Other:  Patient has about a golf ball sized indurated area with a smaller dime size discoloration of skin on top of it with a little bit of mild erythema around it on his right back of his leg upper thigh.  They have pictures from it previously and stayed about the same size.  Discolored skin has been about the same for the past few days not worsening   ED Results / Procedures / Treatments   Labs (all labs ordered are listed, but only abnormal results are displayed) Labs Reviewed  AEROBIC CULTURE [place] (SUPERFICIAL SPECIMEN)     PROCEDURES:  Critical Care performed: No  ..Incision and Drainage  Date/Time: 03/15/2022 11:29 AM  Performed by: Vanessa Reliance, MD Authorized by: Vanessa [place], MD   Consent:    Consent obtained:  Verbal   Consent given by:  Patient   Risks  discussed:  Bleeding, incomplete drainage, pain, damage to other organs and infection   Alternatives discussed:  No treatment Universal protocol:    Patient identity confirmed:  Verbally with patient Location:    Type:  Abscess   Size:  3x3   Location:  Lower extremity   Lower extremity location:  Leg   Leg location:  L upper leg Pre-procedure details:    Skin preparation:  Chlorhexidine Sedation:    Sedation type:  None Anesthesia:    Anesthesia method:  Local infiltration   Local anesthetic:  Lidocaine 1% WITH epi Procedure type:    Complexity:  Simple Procedure details:    Ultrasound guidance: yes     Incision types:  Stab incision   Wound management:  Probed and deloculated and irrigated with saline   Drainage:  Purulent and bloody   Wound treatment:  Wound left open   Packing materials:  None Post-procedure details:    Procedure completion:  Tolerated well, no immediate complications  MEDICATIONS ORDERED IN ED: Medications  lidocaine-EPINEPHrine (XYLOCAINE W/EPI) 1 %-1:100000 (with pres) injection 10 mL (has no administration in time range)     IMPRESSION / MDM / ASSESSMENT AND PLAN / ED COURSE  I reviewed the triage vital signs and the nursing notes.   Patient's presentation is most consistent with acute presentation with potential threat to life or bodily function.   Exam with abscess secondary to possible spider bite versus infected hair was what he was told when it first happened a week ago.  Not progressively getting worse but just not getting better with antibiotics.  We discussed incision and drainage of area which they are amenable to.  He does have a small area of discolored skin on top of it which makes me question of this probably could be a brown recluse spider bite we discussed monitoring this to make sure that the discoloration is not worsening but given this is already been a week out I not feel like labs need to be ordered.  We will give him follow-up  with surgery  We will add on Keflex to patient's antibiotics as well as given additional 3 days of Bactrim for total of 10 days and wound culture was sent that could be also used to change antibiotics if neededpiscoy    FINAL CLINICAL IMPRESSION(S) / ED DIAGNOSES   Final diagnoses:  Insect bite, unspecified site, initial encounter  Abscess     Rx / DC Orders   ED Discharge Orders          Ordered    cephALEXin (KEFLEX) 500 MG capsule  2 times daily        03/15/22 1132    sulfamethoxazole-trimethoprim (BACTRIM DS) 800-160 MG tablet  2 times daily        03/15/22 1132    oxyCODONE (ROXICODONE) 5 MG immediate release tablet  Every 6 hours PRN        03/15/22 1132    AMB referral to wound care center        03/15/22 1133             Note:  This document was prepared using Dragon voice recognition software and may include unintentional dictation errors.   Vanessa Deerwood, MD 03/15/22 1134

## 2022-03-16 ENCOUNTER — Telehealth: Payer: Self-pay

## 2022-03-16 NOTE — Telephone Encounter (Signed)
Message left for the patient to call back to schedule an appointment with Dr Hampton Abbot. Seen in the ER and I&D done for a possible infected insect bite.

## 2022-03-17 ENCOUNTER — Telehealth: Payer: Self-pay

## 2022-03-17 NOTE — Telephone Encounter (Signed)
Transition Care Management Follow-up Telephone Call Date of discharge and from where: 03/15/22 from [facility] ER How have you been since you were released from the hospital? Better Any questions or concerns? No  Items Reviewed: Did the pt receive and understand the discharge instructions provided? Yes  Medications obtained and verified? Yes  Other? No  Any new allergies since your discharge? No  Dietary orders reviewed? No Do you have support at home? Yes   Home Care and Equipment/Supplies: Were home health services ordered? not applicable If so, what is the name of the agency?   Has the agency set up a time to come to the patient's home? not applicable Were any new equipment or medical supplies ordered?  No What is the name of the medical supply agency?  Were you able to get the supplies/equipment? not applicable Do you have any questions related to the use of the equipment or supplies? No  Functional Questionnaire: (I = Independent and D = Dependent) ADLs: I  Bathing/Dressing- I  Meal Prep- I  Eating- I  Maintaining continence- I  Transferring/Ambulation- I  Managing Meds- I  Follow up appointments reviewed:  PCP Hospital f/u appt confirmed? No   Specialist Hospital f/u appt confirmed? No   Are transportation arrangements needed? No  If their condition worsens, is the pt aware to call PCP or go to the Emergency Dept.? Yes Was the patient provided with contact information for the PCP's office or ED? Yes Was to pt encouraged to call back with questions or concerns? Yes

## 2022-03-19 LAB — AEROBIC CULTURE W GRAM STAIN (SUPERFICIAL SPECIMEN)

## 2022-03-25 ENCOUNTER — Ambulatory Visit: Payer: No Typology Code available for payment source | Admitting: Surgery

## 2022-04-06 ENCOUNTER — Encounter (HOSPITAL_BASED_OUTPATIENT_CLINIC_OR_DEPARTMENT_OTHER): Payer: No Typology Code available for payment source | Attending: General Surgery | Admitting: General Surgery

## 2022-04-06 DIAGNOSIS — S70361A Insect bite (nonvenomous), right thigh, initial encounter: Secondary | ICD-10-CM | POA: Diagnosis not present

## 2022-04-06 DIAGNOSIS — I10 Essential (primary) hypertension: Secondary | ICD-10-CM | POA: Insufficient documentation

## 2022-04-06 DIAGNOSIS — E785 Hyperlipidemia, unspecified: Secondary | ICD-10-CM | POA: Insufficient documentation

## 2022-04-06 DIAGNOSIS — E119 Type 2 diabetes mellitus without complications: Secondary | ICD-10-CM | POA: Insufficient documentation

## 2022-04-06 NOTE — Progress Notes (Signed)
Colton, Long ([id number]) [id number] Page 1 of 6 Visit Report for 04/06/2022 Allergy List Details Patient Name: Date of Service: Colton Long, TO BY L. 04/06/2022 9:00 A M Medical Record Number: [id number] Patient Account Number: [id number] Date of Birth/Sex: Treating RN: [birth year] (46 y.o. Cline Cools Primary Care Dasiah Hooley: Dedra Skeens RA MA LEGO Byrd Hesselbach NDER Other Clinician: Referring Dezi Brauner: Treating Shadrach Bartunek/Extender: Loa Socks RA MA LEGO S, A LEXA NDER Weeks in Treatment: 0 Allergies Active Allergies No Known Allergies Allergy Notes Electronic Signature(s) Signed: 04/06/2022 3:10:50 PM By: Redmond Pulling RN, BSN Entered By: Redmond Pulling on 04/06/2022 09:13:12 -------------------------------------------------------------------------------- Arrival Information Details Patient Name: Date of Service: Colton Long, TO BY L. 04/06/2022 9:00 A M Medical Record Number: [id number] Patient Account Number: [id number] Date of Birth/Sex: Treating RN: June 25, 1975 (46 y.o. Colton Long Primary Care Jebediah Macrae: Dedra Skeens RA MA LEGO Byrd Hesselbach NDER Other Clinician: Referring Coralyn Roselli: Treating Geselle Cardosa/Extender: Loa Socks RA MA LEGO S, A LEXA NDER Weeks in Treatment: 0 Visit Information Patient Arrived: Ambulatory Arrival Time: 08:40 Accompanied By: wife Transfer Assistance: None Patient Identification Verified: Yes Secondary Verification Process Completed: Yes Patient Requires Transmission-Based Precautions: No Patient Has Alerts: No Electronic Signature(s) Signed: 04/06/2022 8:53:47 AM By: Dayton Scrape Entered By: Dayton Scrape on 04/06/2022 08:40:57 -------------------------------------------------------------------------------- Clinic Level of Care Assessment Details Patient Name: Date of Service: Colton Long, TO BY L. 04/06/2022 9:00 A M Medical Record Number: [id number] Patient Account Number: [id number] Colton, Long ([id number])  [id number] Page 2 of 6 Date of Birth/Sex: Treating RN: Mar 10, 1976 (46 y.o. Cline Cools Primary Care Makiyah Zentz: Other Clinician: KA RA MA LEGO Byrd Hesselbach NDER Referring Aryssa Rosamond: Treating Merari Pion/Extender: Loa Socks RA MA LEGO S, A LEXA NDER Weeks in Treatment: 0 Clinic Level of Care Assessment Items TOOL 2 Quantity Score X- 1 0 Use when only an EandM is performed on the INITIAL visit ASSESSMENTS - Nursing Assessment / Reassessment X- 1 20 General Physical Exam (combine w/ comprehensive assessment (listed just below) when performed on new pt. evals) X- 1 25 Comprehensive Assessment (HX, ROS, Risk Assessments, Wounds Hx, etc.) ASSESSMENTS - Wound and Skin A ssessment / Reassessment X - Simple Wound Assessment / Reassessment - one wound 1 5 []  - 0 Complex Wound Assessment / Reassessment - multiple wounds []  - 0 Dermatologic / Skin Assessment (not related to wound area) ASSESSMENTS - Ostomy and/or Continence Assessment and Care []  - 0 Incontinence Assessment and Management []  - 0 Ostomy Care Assessment and Management (repouching, etc.) PROCESS - Coordination of Care X - Simple Patient / Family Education for ongoing care 1 15 []  - 0 Complex (extensive) Patient / Family Education for ongoing care X- 1 10 Staff obtains , Records, T Results / Process Orders est []  - 0 Staff telephones HHA, Nursing Homes / Clarify orders / etc []  - 0 Routine Transfer to another Facility (non-emergent condition) []  - 0 Routine Hospital Admission (non-emergent condition) X- 1 15 New Admissions / / Ordering NPWT Apligraf, etc. , []  - 0 Emergency Hospital Admission (emergent condition) X- 1 10 Simple Discharge Coordination []  - 0 Complex (extensive) Discharge Coordination PROCESS - Special Needs []  - 0 Pediatric / Minor Patient Management []  - 0 Isolation Patient Management []  - 0 Hearing / Language / Visual special  needs []  - 0 Assessment of Community assistance (transportation, D/C planning, etc.) []  - 0 Additional assistance / Altered mentation []  - 0 Support Surface(s) Assessment (bed, cushion,  seat, etc.) INTERVENTIONS - Wound Cleansing / Measurement []  - 0 Wound Imaging (photographs - any number of wounds) []  - 0 Wound Tracing (instead of photographs) []  - 0 Simple Wound Measurement - one wound []  - 0 Complex Wound Measurement - multiple wounds []  - 0 Simple Wound Cleansing - one wound []  - 0 Complex Wound Cleansing - multiple wounds INTERVENTIONS - Wound Dressings []  - 0 Small Wound Dressing one or multiple wounds []  - 0 Medium Wound Dressing one or multiple wounds []  - 0 Large Wound Dressing one or multiple wounds []  - 0 Application of Medications - injection Kent, Zhamir L ( ) [id number] Page 3 of 6 INTERVENTIONS - Miscellaneous []  - 0 External ear exam []  - 0 Specimen Collection (cultures, biopsies, blood, body fluids, etc.) []  - 0 Specimen(s) / Culture(s) sent or taken to Lab for analysis []  - 0 Patient Transfer (multiple staff / Lift / Similar devices) []  - 0 Simple Staple / Suture removal (25 or less) []  - 0 Complex Staple / Suture removal (26 or more) []  - 0 Hypo / Hyperglycemic Management (close monitor of Blood Glucose) []  - 0 Ankle / Brachial Index (ABI) - do not check if billed separately Has the patient been seen at the hospital within the last three years: Yes Total Score: 100 Level Of Care: New/Established - Level 3 Electronic Signature(s) Signed: 04/06/2022 3:10:50 PM By: RN, BSN Entered By: on 04/06/2022 15:07:54 -------------------------------------------------------------------------------- Encounter Discharge Information Details Patient Name: Date of Service: Colton Long, TO BY L. 04/06/2022 9:00 A M Medical Record Number: Patient Account Number: Date of  Birth/Sex: Treating RN: 1976/03/07 (46 y.o. Primary Care [name]: RA MA LEGO NDER Other Clinician: Referring Elwanda Moger: Treating Ernisha Sorn/Extender: RA MA LEGO S, A LEXA NDER Weeks in Treatment: 0 Encounter Discharge Information Items Discharge Condition: Stable Ambulatory Status: Ambulatory Discharge Destination: Home Transportation: Private Auto Accompanied By: wife Schedule Follow-up Appointment: Yes Clinical Summary of Care: Patient Declined Electronic Signature(s) Signed: 04/06/2022 3:10:50 PM By: RN, BSN Entered By: on 04/06/2022 15:08:35 -------------------------------------------------------------------------------- Lower Extremity Assessment Details Patient Name: Date of Service: Colton Long, TO BY L. 04/06/2022 9:00 A M Medical Record Number: 13/12/2021 Patient Account Number: Redmond Pulling Date of Birth/Sex: Treating RN: Dec 23, 1975 (46 y.o. 13/12/2021 Primary Care Mercedees Convery: 13/12/2021 RA MA LEGO [id number] NDER Other Clinician: Referring Modelle Vollmer: Treating Saladin Petrelli/Extender: [id number] RA MA LEGO S, A LEXA NDER Weeks in Treatment: 0 Electronic Signature(s) Signed: 04/06/2022 3:10:50 PM By: 52 RN, BSN Entered By: Cline Cools on 04/06/2022 09:19:11 Musa, Byrd Hesselbach (Loa Socks) [id number] Page 4 of 6 -------------------------------------------------------------------------------- Multi Wound Chart Details Patient Name: Date of Service: Colton Long, TO BY L. 04/06/2022 9:00 A M Medical Record Number: Redmond Pulling Patient Account Number: 13/12/2021 Date of Birth/Sex: Treating RN: 23-May-1976 (46 y.o. [id number] Primary Care Oreoluwa Aigner: 07/14/1975 RA MA LEGO Cline Cools NDER Other Clinician: Referring Anamae Rochelle: Treating Kristalynn Coddington/Extender: Dedra Skeens RA MA LEGO S, A LEXA NDER Weeks in Treatment: 0 Vital Signs Height(in): 75 Capillary Blood  Glucose(mg/dl): Byrd Hesselbach Weight(lbs): Loa Socks Pulse(bpm): 85 Body Mass Index(BMI): 36.4 Blood Pressure(mmHg): 107/72 Temperature(F): 98.5 Respiratory Rate(breaths/min): 20 [Treatment Notes:Wound Assessments Treatment Notes] Electronic Signature(s) Signed: 04/06/2022 10:10:50 AM By: Redmond Pulling MD FACS Signed: 04/06/2022 4:59:38 PM By: 13/12/2021 RN, BSN Entered By: Heber  on 04/06/2022 10:10:50 -------------------------------------------------------------------------------- Multi-Disciplinary Care Plan Details Patient Name:  Date of Service: Colton Long, TO BY L. 04/06/2022 9:00 A M Medical Record Number: [id number] Patient Account Number: [id number] Date of Birth/Sex: Treating RN: 1975/07/26 (46 y.o. Cline Cools Primary Care Miaya Lafontant: Dedra Skeens RA MA LEGO Byrd Hesselbach NDER Other Clinician: Referring Desteny Freeman: Treating Josiel Gahm/Extender: Loa Socks RA MA LEGO S, A LEXA NDER Weeks in Treatment: 0 Active Inactive Electronic Signature(s) Signed: 04/06/2022 3:10:50 PM By: Redmond Pulling RN, BSN Entered By: Redmond Pulling on 04/06/2022 15:06:01 -------------------------------------------------------------------------------- Pain Assessment Details Patient Name: Date of Service: Colton Long, TO BY L. 04/06/2022 9:00 A M Medical Record Number: [id number] Patient Account Number: [id number] Date of Birth/Sex: Treating RN: [birth year] (46 y.o. Colton Long Primary Care Tapanga [place]: Dedra Skeens RA MA Eulogio Bear NDER Other Clinician: Referring Tab Rylee: Treating Amardeep Beckers/Extender: Loa Socks RA MA LEGO Byrd Hesselbach NDER Shiela Mayer ([id number]) [id number] Page 5 of 6 Weeks in Treatment: 0 Active Problems Location of Pain Severity and Description of Pain Patient Has Paino No Site Locations Pain Management and Medication Current Pain Management: Electronic Signature(s) Signed: 04/06/2022 8:53:47 AM By: Dayton Scrape Signed: 04/06/2022 4:59:38 PM  By: Zenaida Deed RN, BSN Entered By: Dayton Scrape on 04/06/2022 08:42:06 -------------------------------------------------------------------------------- Patient/Caregiver Education Details Patient Name: Date of Service: Colton Long, TO BY L. 11/8/2023andnbsp9:00 A M Medical Record Number: [id number] Patient Account Number: [id number] Date of Birth/Gender: Treating RN: 27-Jan-1976 (46 y.o. Cline Cools Primary Care Physician: Dedra Skeens RA MA LEGO Byrd Hesselbach NDER Other Clinician: Referring Physician: Treating Physician/Extender: Loa Socks RA MA LEGO S, A LEXA NDER Weeks in Treatment: 0 Education Assessment Education Provided To: Patient Education Topics Provided Wound/Skin Impairment: Methods: Explain/Verbal Responses: State content correctly Electronic Signature(s) Signed: 04/06/2022 3:10:50 PM By: Redmond Pulling RN, BSN Entered By: Redmond Pulling on 04/06/2022 15:06:13 Gettis, Heber [place] ([id number]) [id number] Page 6 of 6 -------------------------------------------------------------------------------- Vitals Details Patient Name: Date of Service: Colton Long, TO BY L. 04/06/2022 9:00 A M Medical Record Number: [id number] Patient Account Number: [id number] Date of Birth/Sex: Treating RN: [birth year] (46 y.o. Colton Long Primary Care Trell Secrist: Dedra Skeens RA MA LEGO Byrd Hesselbach NDER Other Clinician: Referring Abcde Oneil: Treating Katja Blue/Extender: Loa Socks RA MA LEGO S, A LEXA NDER Weeks in Treatment: 0 Vital Signs Time Taken: 08:35 Temperature (F): 98.5 Height (in): 75 Pulse (bpm): 85 Weight (lbs): 291 Respiratory Rate (breaths/min): 20 Body Mass Index (BMI): 36.4 Blood Pressure (mmHg): 107/72 Capillary Blood Glucose (mg/dl): 325 Reference Range: 80 - 120 mg / dl Electronic Signature(s) Signed: 04/06/2022 8:53:47 AM By: Dayton Scrape Entered By: Dayton Scrape on 04/06/2022 08:41:36

## 2022-04-06 NOTE — Progress Notes (Signed)
Colton, Long ([id number]) [id number] Page 1 of 6 Visit Report for 04/06/2022 Chief Complaint Document Details Patient Name: Date of Service: HA RRISO Long, TO BY L. 04/06/2022 9:00 A M Medical Record Number: [id number] Patient Account Number: [id number] Date of Birth/Sex: Treating RN: 1976/05/17 (46 y.o. Colton Long Primary Care Provider: Dedra Skeens RA MA LEGO Byrd Hesselbach NDER Other Clinician: Referring Provider: Treating Provider/Extender: Loa Socks RA MA LEGO S, A LEXA NDER Weeks in Treatment: 0 Information Obtained from: Patient Chief Complaint Patient presents to the wound care center due with non-wound condition(s) Electronic Signature(s) Signed: 04/06/2022 10:11:01 AM By: Duanne Guess MD FACS Entered By: Duanne Guess on 04/06/2022 10:11:00 -------------------------------------------------------------------------------- HPI Details Patient Name: Date of Service: HA RRISO Long, TO BY L. 04/06/2022 9:00 A M Medical Record Number: [id number] Patient Account Number: [id number] Date of Birth/Sex: Treating RN: Jan 24, 1976 (46 y.o. Colton Long Primary Care Provider: Dedra Skeens RA MA LEGO Byrd Hesselbach NDER Other Clinician: Referring Provider: Treating Provider/Extender: Loa Socks RA MA LEGO S, A LEXA NDER Weeks in Treatment: 0 History of Present Illness HPI Description: CONSULTATION ONLY 04/06/2022: This is a 46 year old man who believes that he was bitten by an insect in mid October. The bump became larger and he contacted his primary care provider's office. He was given a 7-day prescription of Bactrim but no IandD was performed. 4 days later, on October 17, he presented to the emergency department at [facility]. IandD was performed. Keflex was added to his antibiotic regimen. It looks like he was given follow-up with surgery, but was also referred to the wound care center. He is here today for that visit. Today, the area  has healed completely. Electronic Signature(s) Signed: 04/06/2022 10:13:15 AM By: Duanne Guess MD FACS Entered By: Duanne Guess on 04/06/2022 10:13:14 -------------------------------------------------------------------------------- Physical Exam Details Patient Name: Date of Service: HA RRISO Long, TO BY L. 04/06/2022 9:00 A M Medical Record Number: [id number] Patient Account Number: [id number] Date of Birth/Sex: Treating RN: [birth year] (46 y.o. Colton Long Primary Care Provider: Dedra Skeens RA MA Eulogio Bear NDER Other Clinician: Referring Provider: Treating Provider/Extender: Loa Socks RA MA LEGO Byrd Hesselbach NDER JIM, LUNDIN ([id number]) [id number] Page 2 of 6 Weeks in Treatment: 0 Constitutional . . . . No acute distress. Respiratory Normal work of breathing on room air.. Notes 04/06/2022: On his posterior right thigh, there is evidence of a prior abscess has now completely resolved and healed. Electronic Signature(s) Signed: 04/06/2022 10:23:49 AM By: Duanne Guess MD FACS Previous Signature: 04/06/2022 10:14:04 AM Version By: Duanne Guess MD FACS Entered By: Duanne Guess on 04/06/2022 10:23:49 -------------------------------------------------------------------------------- Physician Orders Details Patient Name: Date of Service: HA RRISO Long, TO BY L. 04/06/2022 9:00 A M Medical Record Number: [id number] Patient Account Number: [id number] Date of Birth/Sex: Treating RN: 10-20-1975 (46 y.o. Colton Long Primary Care Provider: Dedra Skeens RA MA LEGO Byrd Hesselbach NDER Other Clinician: Referring Provider: Treating Provider/Extender: Loa Socks RA MA LEGO S, A LEXA NDER Weeks in Treatment: 0 Verbal / Phone Orders: No Diagnosis Coding ICD-10 Coding Code Description S70.361S Insect bite (nonvenomous), right thigh, sequela Discharge From [facility] Discharge from Wound Care Center Electronic Signature(s) Signed: 04/06/2022  3:10:50 PM By: Redmond Pulling RN, BSN Signed: 04/06/2022 3:57:12 PM By: Duanne Guess MD FACS Previous Signature: 04/06/2022 10:23:56 AM Version By: Duanne Guess MD FACS Entered By: Redmond Pulling on 04/06/2022 15:05:51 -------------------------------------------------------------------------------- Problem List Details Patient Name: Date of Service:  HA RRISO Long, TO BY L. 04/06/2022 9:00 A M Medical Record Number: [id number] Patient Account Number: [id number] Date of Birth/Sex: Treating RN: Nov 26, 1975 (46 y.o. Colton Long Primary Care Provider: Dedra Skeens RA MA LEGO Byrd Hesselbach NDER Other Clinician: Referring Provider: Treating Provider/Extender: Loa Socks RA MA LEGO S, A LEXA NDER Weeks in Treatment: 0 Active Problems ICD-10 Encounter Code Description Active Date MDM Diagnosis S70.361S Insect bite (nonvenomous), right thigh, sequela 04/06/2022 No Yes Long, Colton L ([id number]) [id number] Page 3 of 6 Inactive Problems Resolved Problems Electronic Signature(s) Signed: 04/06/2022 10:10:47 AM By: Duanne Guess MD FACS Entered By: Duanne Guess on 04/06/2022 10:10:46 -------------------------------------------------------------------------------- Progress Note Details Patient Name: Date of Service: HA RRISO Long, TO BY L. 04/06/2022 9:00 A M Medical Record Number: [id number] Patient Account Number: [id number] Date of Birth/Sex: Treating RN: Dec 13, 1975 (46 y.o. Colton Long Primary Care Provider: Dedra Skeens RA MA LEGO Byrd Hesselbach NDER Other Clinician: Referring Provider: Treating Provider/Extender: Loa Socks RA MA LEGO S, A LEXA NDER Weeks in Treatment: 0 Subjective Chief Complaint Information obtained from Patient Patient presents to the wound care center due with non-wound condition(s) History of Present Illness (HPI) CONSULTATION ONLY 04/06/2022: This is a 46 year old man who believes that he was bitten by an insect in mid October.  The bump became larger and he contacted his primary care provider's office. He was given a 7-day prescription of Bactrim but no IandD was performed. 4 days later, on October 17, he presented to the emergency department at [facility]. IandD was performed. Keflex was added to his antibiotic regimen. It looks like he was given follow-up with surgery, but was also referred to the wound care center. He is here today for that visit. Today, the area has healed completely. Patient History Information obtained from Patient, Chart. Allergies No Known Allergies Family History Cancer - Father, Diabetes - Mother,Father, Heart Disease - Father, Hypertension - Mother,Father. Social History Never smoker, Marital Status - Married, Alcohol Use - Rarely, Drug Use - No History, Caffeine Use - Daily. Medical History Cardiovascular Patient has history of Hypertension Endocrine Patient has history of Type II Diabetes Medical A Surgical History Notes nd Constitutional Symptoms (General Health) morbid obesity, vitamin D deficiency Ear/Nose/Mouth/Throat allergic rhinitis Cardiovascular hyperlipidemia Review of Systems (ROS) Constitutional Symptoms (General Health) Denies complaints or symptoms of Fatigue, Fever, Chills, Marked Weight Change. Eyes Denies complaints or symptoms of Dry Eyes, Vision Changes, Glasses / Contacts. Respiratory Denies complaints or symptoms of Chronic or frequent coughs, Shortness of Breath. Genitourinary Denies complaints or symptoms of Frequent urination. Integumentary (Skin) Denies complaints or symptoms of Wounds. Musculoskeletal Denies complaints or symptoms of Muscle Pain, Muscle Weakness. Neurologic ESSAM, LOWDERMILK ([id number]) [id number] Page 4 of 6 Denies complaints or symptoms of Numbness/parasthesias. Psychiatric Denies complaints or symptoms of Claustrophobia. Objective Constitutional No acute distress. Vitals  Time Taken: 8:35 AM, Height: 75 in, Weight: 291 lbs, BMI: 36.4, Temperature: 98.5 F, Pulse: 85 bpm, Respiratory Rate: 20 breaths/min, Blood Pressure: 107/72 mmHg, Capillary Blood Glucose: 120 mg/dl. Respiratory Normal work of breathing on room air.. General Notes: 04/06/2022: On his posterior right thigh, there is evidence of a prior abscess has now completely resolved and healed. Assessment Active Problems ICD-10 Insect bite (nonvenomous), right thigh, sequela Plan 04/06/2022: This is a 46 year old type II diabetic who had an insect bite to his posterior right thigh. He developed an abscess. This was treated appropriately with incision and drainage and oral antibiotics. This has completely resolved and  does not require any services in the wound care center. He may follow-up as needed. Electronic Signature(s) Signed: 04/06/2022 10:25:15 AM By: Duanne Guess MD FACS Entered By: Duanne Guess on 04/06/2022 10:25:14 -------------------------------------------------------------------------------- HxROS Details Patient Name: Date of Service: HA RRISO Long, TO BY L. 04/06/2022 9:00 A M Medical Record Number: [id number] Patient Account Number: [id number] Date of Birth/Sex: Treating RN: 11-04-1975 (46 y.o. Colton Long Primary Care Provider: Dedra Skeens RA MA LEGO Byrd Hesselbach NDER Other Clinician: Referring Provider: Treating Provider/Extender: Loa Socks RA MA LEGO S, A LEXA NDER Weeks in Treatment: 0 Information Obtained From Patient Chart Constitutional Symptoms (General Health) Complaints and Symptoms: Negative for: Fatigue; Fever; Chills; Marked Weight Change Medical History: Past Medical History NotesCHONG, WOJDYLA ([id number]) [id number] Page 5 of 6 morbid obesity, vitamin D deficiency Eyes Complaints and Symptoms: Negative for: Dry Eyes; Vision Changes; Glasses / Contacts Respiratory Complaints and Symptoms: Negative for: Chronic or  frequent coughs; Shortness of Breath Genitourinary Complaints and Symptoms: Negative for: Frequent urination Integumentary (Skin) Complaints and Symptoms: Negative for: Wounds Musculoskeletal Complaints and Symptoms: Negative for: Muscle Pain; Muscle Weakness Neurologic Complaints and Symptoms: Negative for: Numbness/parasthesias Psychiatric Complaints and Symptoms: Negative for: Claustrophobia Ear/Nose/Mouth/Throat Medical History: Past Medical History Notes: allergic rhinitis Hematologic/Lymphatic Cardiovascular Medical History: Positive for: Hypertension Past Medical History Notes: hyperlipidemia Endocrine Medical History: Positive for: Type II Diabetes Time with diabetes: 15 yrs Immunological Oncologic Immunizations Pneumococcal Vaccine: Received Pneumococcal Vaccination: No Implantable Devices None Family and Social History Cancer: Yes - Father; Diabetes: Yes - Mother,Father; Heart Disease: Yes - Father; Hypertension: Yes - Mother,Father; Never smoker; Marital Status - Married; Alcohol Use: Rarely; Drug Use: No History; Caffeine Use: Daily; Financial Concerns: No; Food, Clothing or Shelter Needs: No; Support System Lacking: No; Transportation Concerns: No Baade, Arcangel L ([id number]) [id number] Page 6 of 6 Electronic Signature(s) Signed: 04/06/2022 12:55:38 PM By: Duanne Guess MD FACS Signed: 04/06/2022 3:10:50 PM By: Redmond Pulling RN, BSN Signed: 04/06/2022 4:59:38 PM By: Zenaida Deed RN, BSN Entered By: Redmond Pulling on 04/06/2022 09:17:39 -------------------------------------------------------------------------------- SuperBill Details Patient Name: Date of Service: HA RRISO Long, TO BY L. 04/06/2022 Medical Record Number: [id number] Patient Account Number: [id number] Date of Birth/Sex: Treating RN: 10-29-1975 (46 y.o. Colton Long Primary Care Provider: Dedra Skeens RA MA LEGO Byrd Hesselbach NDER Other Clinician: Referring  Provider: Treating Provider/Extender: Loa Socks RA MA LEGO S, A LEXA NDER Weeks in Treatment: 0 Diagnosis Coding ICD-10 Codes Code Description S70.361S Insect bite (nonvenomous), right thigh, sequela Facility Procedures : CPT4 Code: [id number] Description: 99213 - WOUND CARE VISIT-LEV 3 EST PT Modifier: 25 Quantity: 1 Physician Procedures : CPT4 Code Description Modifier [id number] WC PHYS LEVEL 3 NEW PT ICD-10 Diagnosis Description S70.361S Insect bite (nonvenomous), right thigh, sequela Quantity: 1 Electronic Signature(s) Signed: 04/06/2022 3:10:50 PM By: Redmond Pulling RN, BSN Signed: 04/06/2022 3:57:12 PM By: Duanne Guess MD FACS Previous Signature: 04/06/2022 10:25:25 AM Version By: Duanne Guess MD FACS Entered By: Redmond Pulling on 04/06/2022 15:08:05

## 2022-04-06 NOTE — Progress Notes (Signed)
EJAY, LASHLEY ([id number]) [id number] Page 1 of 4 Visit Report for 04/06/2022 Abuse Risk Screen Details Patient Name: Date of Service: [name], TO BY L. 04/06/2022 9:00 A M Medical Record Number: [id number] Patient Account Number: [id number] Date of Birth/Sex: Treating RN: [birth year] (46 y.o. Cline Cools Primary Care Yuna Pizzolato: Dedra Skeens RA MA LEGO Byrd Hesselbach NDER Other Clinician: Referring Braysen Cloward: Treating Yadira Hada/Extender: Loa Socks RA MA LEGO S, A LEXA NDER Weeks in Treatment: 0 Abuse Risk Screen Items Answer ABUSE RISK SCREEN: Has anyone close to you tried to hurt or harm you recentlyo No Do you feel uncomfortable with anyone in your familyo No Has anyone forced you do things that you didnt want to doo No Electronic Signature(s) Signed: 04/06/2022 3:10:50 PM By: Redmond Pulling RN, BSN Entered By: Redmond Pulling on 04/06/2022 09:17:48 -------------------------------------------------------------------------------- Activities of Daily Living Details Patient Name: Date of Service: [name], TO BY L. 04/06/2022 9:00 A M Medical Record Number: [id number] Patient Account Number: [id number] Date of Birth/Sex: Treating RN: Aug 06, 1975 (46 y.o. Cline Cools Primary Care Neyda Durango: Dedra Skeens RA MA LEGO Byrd Hesselbach NDER Other Clinician: Referring Haidan Nhan: Treating Rhiley Tarver/Extender: Loa Socks RA MA LEGO S, A LEXA NDER Weeks in Treatment: 0 Activities of Daily Living Items Answer Activities of Daily Living (Please select one for each item) Drive Automobile Completely Able T Medications ake Completely Able Use T elephone Completely Able Care for Appearance Completely Able Use T oilet Completely Able Bath / Shower Completely Able Dress Self Completely Able Feed Self Completely Able Walk Completely Able Get In / Out Bed Completely Able Housework Completely Able Prepare Meals Completely Able Handle Money Completely Able Shop for  Self Completely Able Electronic Signature(s) Signed: 04/06/2022 3:10:50 PM By: Redmond Pulling RN, BSN Entered By: Redmond Pulling on 04/06/2022 09:18:11 Gunkel, Heber [place] ([id number]) [id number] Page 2 of 4 -------------------------------------------------------------------------------- Education Screening Details Patient Name: Date of Service: [name], TO BY L. 04/06/2022 9:00 A M Medical Record Number: [id number] Patient Account Number: [id number] Date of Birth/Sex: Treating RN: [birth year] (46 y.o. Cline Cools Primary Care Bailie Christenbury: Dedra Skeens RA MA LEGO Byrd Hesselbach NDER Other Clinician: Referring Oriyah Lamphear: Treating Detta Mellin/Extender: Loa Socks RA MA LEGO S, A LEXA NDER Weeks in Treatment: 0 Primary Learner Assessed: Patient Learning Preferences/Education Level/Primary Language Learning Preference: Explanation, Demonstration, Printed Material Preferred Language: English Cognitive Barrier Language Barrier: No Translator Needed: No Memory Deficit: No Emotional Barrier: No Cultural/Religious Beliefs Affecting Medical Care: No Physical Barrier Impaired Vision: No Impaired Hearing: No Decreased Hand dexterity: No Knowledge/Comprehension Knowledge Level: High Comprehension Level: High Ability to understand written instructions: High Ability to understand verbal instructions: High Motivation Anxiety Level: Calm Cooperation: Cooperative Education Importance: Acknowledges Need Interest in Health Problems: Asks Questions Perception: Coherent Willingness to Engage in Self-Management High Activities: Readiness to Engage in Self-Management High Activities: Electronic Signature(s) Signed: 04/06/2022 3:10:50 PM By: Redmond Pulling RN, BSN Entered By: Redmond Pulling on 04/06/2022 09:18:36 -------------------------------------------------------------------------------- Fall Risk Assessment Details Patient Name: Date of Service: [name], TO BY  L. 04/06/2022 9:00 A M Medical Record Number: [id number] Patient Account Number: [id number] Date of Birth/Sex: Treating RN: Sep 22, 1975 (46 y.o. Cline Cools Primary Care Jovie Swanner: Dedra Skeens RA MA LEGO Byrd Hesselbach NDER Other Clinician: Referring Chriselda Leppert: Treating Jenisa Monty/Extender: Loa Socks RA MA LEGO S, A LEXA NDER Weeks in Treatment: 0 Fall Risk Assessment Items Have you had 2 or more falls in the last 12 monthso 0 No  Have you had any fall that resulted in injury in the last 12 monthso 0 No Mcnally, Irene L ([id number]) [id number] Page 3 of 4 FALLS RISK SCREEN History of falling - immediate or within 3 months 0 No Secondary diagnosis (Do you have 2 or more medical diagnoseso) 0 No Ambulatory aid None/bed rest/wheelchair/nurse 0 No Crutches/cane/walker 0 No Furniture 0 No Intravenous therapy Access/Saline/Heparin Lock 0 No Gait/Transferring Normal/ bed rest/ wheelchair 0 No Weak (short steps with or without shuffle, stooped but able to lift head while walking, may seek 0 No support from furniture) Impaired (short steps with shuffle, may have difficulty arising from chair, head down, impaired 0 No balance) Mental Status Oriented to own ability 0 No Electronic Signature(s) Signed: 04/06/2022 3:10:50 PM By: Redmond Pulling RN, BSN Entered By: Redmond Pulling on 04/06/2022 09:18:45 -------------------------------------------------------------------------------- Foot Assessment Details Patient Name: Date of Service: [name], TO BY L. 04/06/2022 9:00 A M Medical Record Number: [id number] Patient Account Number: [id number] Date of Birth/Sex: Treating RN: 08-18-1975 (46 y.o. Cline Cools Primary Care Jermesha Sottile: Dedra Skeens RA MA LEGO Byrd Hesselbach NDER Other Clinician: Referring Sofhia Ulibarri: Treating Mikeala Girdler/Extender: Loa Socks RA MA LEGO S, A LEXA NDER Weeks in Treatment: 0 Foot Assessment Items Site Locations + = Sensation present, - =  Sensation absent, C = Callus, U = Ulcer R = Redness, W = Warmth, M = Maceration, PU = Pre-ulcerative lesion F = Fissure, S = Swelling, D = Dryness Assessment Right: Left: Other Deformity: No No Prior Foot Ulcer: No No Prior Amputation: No No Charcot Joint: No No Ambulatory Status: Gait: Cherian, Burrel L ([id number]) [id number] Page 4 of 4 Electronic Signature(s) Signed: 04/06/2022 3:10:50 PM By: Redmond Pulling RN, BSN Entered By: Redmond Pulling on 04/06/2022 09:19:05 -------------------------------------------------------------------------------- Nutrition Risk Screening Details Patient Name: Date of Service: [name], TO BY L. 04/06/2022 9:00 A M Medical Record Number: [id number] Patient Account Number: [id number] Date of Birth/Sex: Treating RN: 04-12-1976 (46 y.o. Cline Cools Primary Care Brenton Joines: Dedra Skeens RA MA LEGO Byrd Hesselbach NDER Other Clinician: Referring Bertran Zeimet: Treating Zylie Mumaw/Extender: Loa Socks RA MA LEGO S, A LEXA NDER Weeks in Treatment: 0 Height (in): 75 Weight (lbs): 291 Body Mass Index (BMI): 36.4 Nutrition Risk Screening Items Score Screening NUTRITION RISK SCREEN: I have an illness or condition that made me change the kind and/or amount of food I eat 0 No I eat fewer than two meals per day 0 No I eat few fruits and vegetables, or milk products 0 No I have three or more drinks of beer, liquor or wine almost every day 0 No I have tooth or mouth problems that make it hard for me to eat 0 No I don't always have enough money to buy the food I need 0 No I eat alone most of the time 0 No I take three or more different prescribed or over-the-counter drugs a day 0 No Without wanting to, I have lost or gained 10 pounds in the last six months 0 No I am not always physically able to shop, cook and/or feed myself 0 No Nutrition Protocols Good Risk Protocol Moderate Risk Protocol High Risk Proctocol Risk Level: Good  Risk Score: 0 Electronic Signature(s) Signed: 04/06/2022 3:10:50 PM By: Redmond Pulling RN, BSN Entered By: Redmond Pulling on 04/06/2022 09:19:00

## 2022-04-18 ENCOUNTER — Other Ambulatory Visit: Payer: Self-pay | Admitting: Family Medicine

## 2022-04-18 DIAGNOSIS — E1169 Type 2 diabetes mellitus with other specified complication: Secondary | ICD-10-CM

## 2022-04-18 NOTE — Telephone Encounter (Signed)
Requested Prescriptions  Pending Prescriptions Disp Refills   metFORMIN (GLUCOPHAGE) 1000 MG tablet [Pharmacy Med Name: METFORMIN 1000MG TABLETS] 180 tablet 0    Sig: TAKE 1 TABLET(1000 MG) BY MOUTH TWICE DAILY WITH A MEAL     Endocrinology:  Diabetes - Biguanides Failed - 04/18/2022  6:39 AM      Failed - eGFR in normal range and within 360 days    GFR, Est African American  Date Value Ref Range Status  11/04/2020 127 > OR = 60 mL/min/1.56m Final   GFR, Est Non African American  Date Value Ref Range Status  11/04/2020 110 > OR = 60 mL/min/1.798mFinal         Failed - B12 Level in normal range and within 720 days    No results found for: "VITAMINB12"       Passed - Cr in normal range and within 360 days    Creat  Date Value Ref Range Status  11/05/2021 0.81 0.60 - 1.29 mg/dL Final   Creatinine, Urine  Date Value Ref Range Status  11/10/2021 251 20 - 320 mg/dL Final         Passed - HBA1C is between 0 and 7.9 and within 180 days    Hemoglobin A1C  Date Value Ref Range Status  12/27/2016 6.2  Final    Comment:    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 . For someone with known diabetes, a value <7% indicates that their diabetes is well controlled. A1c targets should be individualized based on duration of diabetes, age, comorbid conditions, and other considerations. . This assay result is consistent with an increased risk of diabetes. . Currently, no consensus exists regarding use of hemoglobin A1c for diagnosis of diabetes for children. . Renella Cunas Valid encounter within last 6 months    Recent Outpatient Visits           1 month ago Abscess of right thigh   [facility] 5 months ago [facility]   11 months ago Type 2 diabetes mellitus with other specified complication, without long-term current use of insulin ([facility]  [facility]   1 year [facility]   1 year ago Type 2 diabetes mellitus with other specified complication, without long-term current use of insulin ([place]       Future Appointments             In 3 weeks [facility] normal limits and completed in the last 12 months    WBC  Date Value Ref Range Status  11/05/2021 5.7 3.8 - 10.8 Thousand/uL Final   RBC  Date Value Ref Range Status  11/05/2021 4.88 4.20 - 5.80 Million/uL Final   Hemoglobin  Date Value  Ref Range Status  11/05/2021 15.0 13.2 - 17.1 g/dL Final   HCT  Date Value Ref Range Status  11/05/2021 44.8 38.5 - 50.0 % Final   MCHC  Date Value Ref Range Status  11/05/2021 33.5 32.0 - 36.0 g/dL Final   [facility]  Date Value Ref Range Status  11/05/2021 30.7 27.0 - 33.0 pg Final   MCV  Date Value Ref Range Status  11/05/2021 91.8 80.0 - 100.0 fL Final   No results found for: "PLTCOUNTKUC", "LABPLAT", "POCPLA" RDW  Date Value Ref Range Status  11/05/2021 12.7 11.0 - 15.0 % Final

## 2022-05-12 ENCOUNTER — Other Ambulatory Visit: Payer: Self-pay | Admitting: Family Medicine

## 2022-05-12 ENCOUNTER — Ambulatory Visit: Payer: No Typology Code available for payment source | Admitting: Family Medicine

## 2022-05-12 ENCOUNTER — Encounter: Payer: Self-pay | Admitting: Family Medicine

## 2022-05-12 VITALS — BP 120/84 | HR 94 | Ht 75.0 in | Wt 288.0 lb

## 2022-05-12 DIAGNOSIS — Z125 Encounter for screening for malignant neoplasm of prostate: Secondary | ICD-10-CM

## 2022-05-12 DIAGNOSIS — E1169 Type 2 diabetes mellitus with other specified complication: Secondary | ICD-10-CM

## 2022-05-12 DIAGNOSIS — Z0000 Encounter for general adult medical examination without abnormal findings: Secondary | ICD-10-CM

## 2022-05-12 DIAGNOSIS — I10 Essential (primary) hypertension: Secondary | ICD-10-CM | POA: Diagnosis not present

## 2022-05-12 DIAGNOSIS — F419 Anxiety disorder, unspecified: Secondary | ICD-10-CM

## 2022-05-12 DIAGNOSIS — Z8614 Personal history of Methicillin resistant Staphylococcus aureus infection: Secondary | ICD-10-CM | POA: Diagnosis not present

## 2022-05-12 LAB — POCT GLYCOSYLATED HEMOGLOBIN (HGB A1C): Hemoglobin A1C: 6.4 % — AB (ref 4.0–5.6)

## 2022-05-12 MED ORDER — MUPIROCIN 2 % EX OINT: 1.0000 | TOPICAL_OINTMENT | Freq: Two times a day (BID) | CUTANEOUS | 2 refills | Status: AC

## 2022-05-12 MED ORDER — MOUNJARO 7.5 MG/0.5ML ~~LOC~~ SOAJ: 7.5000 mg | SUBCUTANEOUS | 0 refills | Status: DC

## 2022-05-12 NOTE — Assessment & Plan Note (Signed)
Well-controlled HTN - low normal - Home BP readings normal  No known complications     Plan:  1. Continue Losartan 50mg  daily 2. Encourage improved lifestyle - low sodium diet, regular exercise 3. Continue monitor BP outside office, bring readings to next visit, if persistently >140/90 or new symptoms notify office sooner

## 2022-05-12 NOTE — Patient Instructions (Addendum)
Thank you for coming to the office today.  Recent Labs    05/18/21 0815 11/05/21 0803 05/12/22 0815  HGBA1C 6.4* 6.4* 6.4*   Switch to Mounjaro 7.5mg  weekly inj when ready. Let me know how it goes, and we can order higher dose up to 10mg  or more if needed and the 90 day if need  Recommend Shingrix vaccine age 48+  Send in Cologuard whenever you are ready and have checked insurance.  -------------------------  Ordered Mupirocin topical ointment as needed instead of neosporin for any future wounds or scrapes or injuries of skin. Or abscess.  DUE for FASTING BLOOD WORK (no food or drink after midnight before the lab appointment, only water or coffee without cream/sugar on the morning of)  SCHEDULE "Lab Only" visit in the morning at the clinic for lab draw in 6 MONTHS   - Make sure Lab Only appointment is at about 1 week before your next appointment, so that results will be available  For Lab Results, once available within 2-3 days of blood draw, you can can log in to MyChart online to view your results and a brief explanation. Also, we can discuss results at next follow-up visit.    Please schedule a Follow-up Appointment to: Return in about 6 months (around 11/11/2022) for 6 month fasting lab only then 1 week later Annual Physical.  If you have any other questions or concerns, please feel free to call the office or send a message through MyChart. You may also schedule an earlier appointment if necessary.  Additionally, you may be receiving a survey about your experience at our office within a few days to 1 week by e-mail or mail. We value your feedback.  11/13/2022, DO [facility], [facility]

## 2022-05-12 NOTE — Assessment & Plan Note (Signed)
Comorbid factors BMI < 40, with BMI 36 Notable weight loss T2DM, HTN HLD On GLP1

## 2022-05-12 NOTE — Progress Notes (Signed)
Subjective:    Patient ID: Colton Long, male    DOB: Oct 20, 1975, 46 y.o.   MRN: [id number]  Colton Long is a 46 y.o. male presenting on 05/12/2022 for Diabetes   HPI  CHRONIC DM, Type 2 / MORBID OBESITY BMI 36 Today reports doing very well. A1c previously 6-7 range Due for A1c today, last 6.4 CBG - avg sugar improved Meds: Ozempic 59m weekly [place] inj (Saturday), Metformin 10019mBID Reports good compliance. Tolerating well w/o side-effects Currently on ARB (Losartan) Lifestyle: Weight down. - Improving walking regularly, not as much lately. - Improving carb control DM diet, following a similar ketogenic diet - Dr WoEllin Mayhew[facility], last visit 02/15/22 updated - Denies hypoglycemia   CHRONIC HTN: Reports doing well without concern Current Meds - Losartan 5010maily   Reports good compliance, took meds today. Tolerating well, w/o complaints. Denies CP, dyspnea, HA, edema, dizziness / lightheadedness   Chronic Anxiety and Intermittent Stress - Prior history of anxiety >15-20 years ago, treated with Paxil in past,  - Currently taking Fluoxetine 75m45mily, had been on from his prior PCP for several years ago, with good results Refill due today   Health Maintenance: Recommend Cologuard order, he has kit, he will complete it once confirms coverage     05/12/2022    8:55 AM 03/11/2022    9:48 AM 05/18/2021    8:24 AM  Depression screen PHQ 2/9  Decreased Interest 0 0 0  Down, Depressed, Hopeless 0 0 0  PHQ - 2 Score 0 0 0  Altered sleeping 0 0 0  Tired, decreased energy 0 0 0  Change in appetite 0 0 0  Feeling bad or failure about yourself  0 0 0  Trouble concentrating 0 0 0  Moving slowly or fidgety/restless 0 0 0  Suicidal thoughts 0 0 0  PHQ-9 Score 0 0 0  Difficult doing work/chores Not difficult at all Not difficult at all Not difficult at all    Social History   Tobacco Use   Smoking status: Never   Smokeless tobacco: Never  Vaping Use   Vaping Use:  Never used  Substance Use Topics   Alcohol use: Yes    Alcohol/week: 0.0 standard drinks of alcohol    Comment: occasional   Drug use: No    Review of Systems Per HPI unless specifically indicated above     Objective:    BP 120/84   Pulse 94   Ht _0  (1.905 m)   Wt 288 lb (130.6 kg)   SpO2 99%   BMI 36.00 kg/m   Wt Readings from Last 3 Encounters:  05/12/22 288 lb (130.6 kg)  03/15/22 290 lb 12.6 oz (131.9 kg)  03/11/22 291 lb (132 kg)    Physical Exam Vitals and nursing note reviewed.  Constitutional:      General: He is not in acute distress.    Appearance: He is well-developed. He is obese. He is not diaphoretic.     Comments: Well-appearing, comfortable, cooperative  HENT:     Head: Normocephalic and atraumatic.  Eyes:     General:        Right eye: No discharge.        Left eye: No discharge.     Conjunctiva/sclera: Conjunctivae normal.  Neck:     Thyroid: No thyromegaly.  Cardiovascular:     Rate and Rhythm: Normal rate and regular rhythm.     Pulses: Normal pulses.  Heart sounds: Normal heart sounds. No murmur heard. Pulmonary:     Effort: Pulmonary effort is normal. No respiratory distress.     Breath sounds: Normal breath sounds. No wheezing or rales.  Musculoskeletal:        General: Normal range of motion.     Cervical back: Normal range of motion and neck supple.  Lymphadenopathy:     Cervical: No cervical adenopathy.  Skin:    General: Skin is warm and dry.     Findings: No erythema or rash.  Neurological:     Mental Status: He is alert and oriented to person, place, and time. Mental status is at baseline.  Psychiatric:        Behavior: Behavior normal.     Comments: Well groomed, good eye contact, normal speech and thoughts       Results for orders placed or performed in visit on 05/12/22  POCT HgB A1C  Result Value Ref Range   Hemoglobin A1C 6.4 (A) 4.0 - 5.6 %      Assessment & Plan:   Problem List Items Addressed This  Visit     Anxiety disorder    Stable, controlled On Fluoxetine 51m daily      Essential hypertension    Well-controlled HTN - low normal - Home BP readings normal  No known complications     Plan:  1. Continue Losartan 526mdaily 2. Encourage improved lifestyle - low sodium diet, regular exercise 3. Continue monitor BP outside office, bring readings to next visit, if persistently >140/90 or new symptoms notify office sooner      Morbid obesity (HCSan Mar   Comorbid factors BMI < 40, with BMI 36 Notable weight loss T2DM, HTN HLD On GLP1      Relevant Medications   MOUNJARO 7.5 MG/0.5ML Pen   Type 2 diabetes mellitus with other specified complication (HCC) - Primary    Controlled DM A1c at 6.4  still unchanged doing well Overall improved on GLP1 top dose Ozempic and lifestyle improvements Complicated by HTN. HLD. Morbid obesity. Failed SGLT2 (jardiance - UTI), Actos  Plan:  Switch from Ozempic 36m78meekly over to [facility]61[facility] inj, instead of dose titration. New rx sent. Continue Metformin 1000mg38m for now still, goal to taper down - Encouraged continue work on implementing healthy DM diet, limited portion, reviewed reduced carb plan, continue improve hydration, continue regular exercise, treadmill Continue ARB, statin      Relevant Medications   MOUNJARO 7.5 MG/0.5ML Pen   Other Relevant Orders   POCT HgB A1C (Completed)   Other Visit Diagnoses     History of methicillin resistant staphylococcus aureus (MRSA)       Relevant Medications   mupirocin ointment (BACTROBAN) 2 %       History of prior infection following insect bite in October 2023 Culture found MRSA Resolved now Rx Ordered Mupirocin topical ointment as needed instead of neosporin for any future wounds or scrapes or injuries of skin. Or abscess.   Meds ordered this encounter  Medications   mupirocin ointment (BACTROBAN) 2 %    Sig: Apply 1 Application topically 2 (two) times daily. For 1-2  weeks as needed for skin sores.    Dispense:  22 g    Refill:  2   MOUNJARO 7.5 MG/0.5ML Pen    Sig: Inject 7.5 mg into the skin once a week.    Dispense:  2 mL    Refill:  0    Switch  from [place]      Follow up plan: Return in about 6 months (around 11/11/2022) for 6 month fasting lab only then 1 week later Annual Physical.  Future labs ordered for 10/2022   Nobie Putnam, [facility] 05/12/2022, 8:14 AM

## 2022-05-12 NOTE — Assessment & Plan Note (Signed)
Stable, controlled On Fluoxetine 20mg  daily

## 2022-05-12 NOTE — Assessment & Plan Note (Signed)
Controlled DM A1c at 6.4  still unchanged doing well Overall improved on GLP1 top dose Ozempic and lifestyle improvements Complicated by HTN. HLD. Morbid obesity. Failed SGLT2 (jardiance - UTI), Actos  Plan:  Switch from Ozempic 2mg  weekly over to [facility] 7.5mg  weekly inj, instead of dose titration. New rx sent. Continue Metformin 1000mg  BID for now still, goal to taper down - Encouraged continue work on implementing healthy DM diet, limited portion, reviewed reduced carb plan, continue improve hydration, continue regular exercise, treadmill Continue ARB, statin

## 2022-06-22 ENCOUNTER — Other Ambulatory Visit: Payer: Self-pay | Admitting: Family Medicine

## 2022-06-22 DIAGNOSIS — F419 Anxiety disorder, unspecified: Secondary | ICD-10-CM

## 2022-06-23 NOTE — Telephone Encounter (Signed)
Requested medication (s) are due for refill today: expired medication  Requested medication (s) are on the active medication list: yes  Last refill:  05/18/21 #90 3 refills  Future visit scheduled: yes in 4 months  Notes to clinic:  expired medication. Do you want to renew Rx?     Requested Prescriptions  Pending Prescriptions Disp Refills   FLUoxetine (PROZAC) 20 MG capsule [Pharmacy Med Name: FLUOXETINE 20MG  CAPSULES] 90 capsule 3    Sig: TAKE 1 CAPSULE(20 MG) BY MOUTH DAILY     Psychiatry:  Antidepressants - SSRI Passed - 06/22/2022 10:06 PM      Passed - Valid encounter within last 6 months    Recent Outpatient Visits           1 month ago Type 2 diabetes mellitus with other specified complication, without long-term current use of insulin [facility])   [place], DO   3 months ago Abscess of right thigh   [facility] Mecum, Dani Gobble, [place]   7 months ago Annual physical exam   [facility] [place], Devonne Doughty, DO   1 year ago Type 2 diabetes mellitus with other specified complication, without long-term current use of insulin [facility])   [facility] Olin Hauser, DO   1 year ago Annual physical exam   [facility] Olin Hauser, DO       Future Appointments             In 4 months [place], Devonne Doughty, DO [facility], [facility]

## 2022-06-25 ENCOUNTER — Other Ambulatory Visit: Payer: Self-pay | Admitting: Family Medicine

## 2022-06-25 DIAGNOSIS — E1169 Type 2 diabetes mellitus with other specified complication: Secondary | ICD-10-CM

## 2022-06-27 NOTE — Telephone Encounter (Signed)
Requested medication (s) are due for refill today: yes  Requested medication (s) are on the active medication list: yes  Last refill:  05/12/22  Future visit scheduled: yes  Notes to clinic:  Medication not assigned to a protocol, review manually.      Requested Prescriptions  Pending Prescriptions Disp Refills   MOUNJARO 7.5 MG/0.5ML Pen [Pharmacy Med Name: MOUNJARO 7.5MG /0.5ML PF PEN INJ] 2 mL 0    Sig: ADMINISTER 7.5 MG UNDER THE SKIN 1 TIME A WEEK     Off-Protocol Failed - 06/25/2022  7:54 AM      Failed - Medication not assigned to a protocol, review manually.      Passed - Valid encounter within last 12 months    Recent Outpatient Visits           1 month ago Type 2 diabetes mellitus with other specified complication, without long-term current use of insulin [facility])   [place], DO   3 months ago Abscess of right thigh   [facility] Mecum, Dani Gobble, [place]   7 months ago Annual physical exam   [facility] Olin Hauser, DO   1 year ago Type 2 diabetes mellitus with other specified complication, without long-term current use of insulin [facility])   [facility] Olin Hauser, DO   1 year ago Annual physical exam   [facility] Olin Hauser, DO       Future Appointments             In 4 months Parks Ranger, Devonne Doughty, [facility], [facility]

## 2022-07-19 ENCOUNTER — Other Ambulatory Visit: Payer: Self-pay | Admitting: Family Medicine

## 2022-07-19 DIAGNOSIS — E1169 Type 2 diabetes mellitus with other specified complication: Secondary | ICD-10-CM

## 2022-07-19 NOTE — Telephone Encounter (Signed)
Medication Refill - Medication: MOUNJARO 7.5 MG/0.5ML Pen   Has the patient contacted their pharmacy? No. (Agent: If no, request that the patient contact the pharmacy for the refill. If patient does not wish to contact the pharmacy document the reason why and proceed with request.) (Agent: If yes, when and what did the pharmacy advise?)  Preferred Pharmacy (with phone number or street name):  [facility] [place], [address] [address] Phone: [phone]  Fax: [phone]     Has the patient been seen for an appointment in the last year OR does the patient have an upcoming appointment? Yes.    Agent: Please be advised that RX refills may take up to 3 business days. We ask that you follow-up with your pharmacy.  *patient would like to know if provider can increase the dosage*

## 2022-07-20 MED ORDER — MOUNJARO 7.5 MG/0.5ML ~~LOC~~ SOAJ: 7.5000 mg | SUBCUTANEOUS | 3 refills | Status: DC

## 2022-07-20 NOTE — Telephone Encounter (Signed)
Requested medication (s) are due for refill today - no  Requested medication (s) are on the active medication list -yes  Future visit scheduled -yes  Last refill: 06/27/22 73m 5RF  Notes to clinic: off protocol medication- provider review   Requested Prescriptions  Pending Prescriptions Disp Refills   MOUNJARO 7.5 MG/0.5ML Pen 2 mL 5     Off-Protocol Failed - 07/19/2022  4:26 PM      Failed - Medication not assigned to a protocol, review manually.      Passed - Valid encounter within last 12 months    Recent Outpatient Visits           2 months ago Type 2 diabetes mellitus with other specified complication, without long-term current use of insulin ([facility]   [place], DO   4 months ago Abscess of right thigh   [facility] [place]  8 months ago Annual physical exam   [facility]   1 year ago Type 2 diabetes mellitus with other specified complication, without long-term current use of insulin ([facility]   [place]   1 year ago Annual physical exam   [place] DO       Future Appointments             In 4 months KParks Ranger ADevonne Doughty DO [facility]              Requested Prescriptions  Pending Prescriptions Disp Refills   MOUNJARO 7.5 MG/0.5ML Pen 2 mL 5     Off-Protocol Failed - 07/19/2022  4:26 PM      Failed - Medication not assigned to a protocol, review manually.      Passed - Valid encounter within last 12 months    Recent Outpatient Visits           2 months ago Type 2 diabetes mellitus with other specified complication, without long-term current use of insulin ([facility]   CTrinityJ, DO   4 months ago Abscess  of right thigh   [facility] [place]  8 months ago Annual physical exam   [facility]   1 year ago Type 2 diabetes mellitus with other specified complication, without long-term current use of insulin ([facility]   [facility]   1 year ago Annual physical exam   [facility]       Future Appointments             In 4 months KParks Ranger ADevonne Doughty [facility]

## 2022-07-25 NOTE — Telephone Encounter (Signed)
Pt wife called in stated pt would like to increase dose for medication MOUNJARO 7.5 MG/0.5ML Pen and this was requested when they asked for a refill; however, the same dose of 7.5 MG was sent in.  Pt is due for medication Saturday  Please advise.

## 2022-08-28 ENCOUNTER — Other Ambulatory Visit: Payer: Self-pay | Admitting: Family Medicine

## 2022-08-28 DIAGNOSIS — E1169 Type 2 diabetes mellitus with other specified complication: Secondary | ICD-10-CM

## 2022-08-29 NOTE — Telephone Encounter (Signed)
Requested Prescriptions  Pending Prescriptions Disp Refills   metFORMIN (GLUCOPHAGE) 1000 MG tablet [Pharmacy Med Name: METFORMIN 1000MG  TABLETS] 180 tablet 0    Sig: TAKE 1 TABLET(1000 MG) BY MOUTH TWICE DAILY WITH A MEAL     Endocrinology:  Diabetes - Biguanides Failed - 08/28/2022 11:47 PM      Failed - eGFR in normal range and within 360 days    GFR, Est African American  Date Value Ref Range Status  11/04/2020 127 > OR = 60 mL/min/1.52m2 Final   GFR, Est Non African American  Date Value Ref Range Status  11/04/2020 110 > OR = 60 mL/min/1.18m2 Final         Failed - B12 Level in normal range and within 720 days    No results found for: "VITAMINB12"       Passed - Cr in normal range and within 360 days    Creat  Date Value Ref Range Status  11/05/2021 0.81 0.60 - 1.29 mg/dL Final   Creatinine, Urine  Date Value Ref Range Status  11/10/2021 251 20 - 320 mg/dL Final         Passed - HBA1C is between 0 and 7.9 and within 180 days    Hemoglobin A1C  Date Value Ref Range Status  05/12/2022 6.4 (A) 4.0 - 5.6 % Final  12/27/2016 6.2  Final    Comment:    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 . For someone with known diabetes, a value <7% indicates that their diabetes is well controlled. A1c targets should be individualized based on duration of diabetes, age, comorbid conditions, and other considerations. . This assay result is consistent with an increased risk of diabetes. . Currently, no consensus exists regarding use of hemoglobin A1c for diagnosis of diabetes for children. Renella Cunas - Valid encounter within last 6 months    Recent Outpatient Visits           3 months ago Type 2  diabetes mellitus with other specified complication, without long-term current use of insulin [facility])   [place] J, DO   5 months ago Abscess of right thigh   [facility] Mecum, Dani Gobble, [place]   9 months ago Annual physical exam   [facility] Olin Hauser, DO   1 year ago Type 2 diabetes mellitus with other specified complication, without long-term current use of insulin [facility])   [place], DO   1 year ago Annual physical exam   [place], DO       Future Appointments             In 2 months Parks Ranger, Devonne Doughty, [facility], [place] within normal limits and completed in the last 12 months    WBC  Date Value Ref Range Status  11/05/2021 5.7 3.8 - 10.8 Thousand/uL Final   RBC  Date Value Ref Range Status  11/05/2021 4.88 4.20 - 5.80 Million/uL Final   Hemoglobin  Date Value Ref Range Status  11/05/2021 15.0 13.2 - 17.1 g/dL Final   HCT  Date Value Ref Range Status  11/05/2021 44.8 38.5 - 50.0 % Final   MCHC  Date Value Ref Range Status  11/05/2021 33.5 32.0 - 36.0 g/dL Final   [facility]  Date Value Ref Range Status  11/05/2021 30.7 27.0 - 33.0 pg Final   MCV  Date Value Ref Range Status  11/05/2021 91.8 80.0 - 100.0 fL Final   No results found for: "PLTCOUNTKUC", "LABPLAT", "POCPLA" RDW  Date Value Ref Range Status  11/05/2021 12.7 11.0 - 15.0 % Final

## 2022-10-05 ENCOUNTER — Telehealth: Payer: Self-pay | Admitting: Family Medicine

## 2022-10-05 NOTE — Telephone Encounter (Signed)
Left message for patient to contact other pharmacies to see if they have the medication.

## 2022-10-05 NOTE — Telephone Encounter (Signed)
Thank you.  Yes unfortunately this has been a common theme right now.  Mounjaro 7.5mg  is one of the hardest ones on the entire list to get.  The other doses are easier to find according to the last data we received from 09/28/22.  He could check pharmacies for 5mg  or 10mg  if he is okay to adjust dose. We could do a temporary 5mg  or increase to 10mg  if interested.  Either would be safe and not cause any major issues.  The 10 is more available than the 5mg .  [facility] [address] may have 5 or 7.5 [employer] may have any dose. [facility] [place] [place] may have 7.5 or [address] may have 5 or 7.5  Once they pick one and find a location - please contact back message or call and I can send an order.  Saralyn Pilar, DO [facility] 10/05/2022, 12:13 PM

## 2022-10-05 NOTE — Telephone Encounter (Signed)
Pt's wife is calling in because [facility] 7.5 MG/0.5ML Pen [[id number]] is out of stock at their pharmacy. Clydie Braun (wife) says the medication has been out of stock for over a week and pt wants to know what they should do. Please follow up with pt or pt's wife. Pt only has about 2 shots left.

## 2022-11-04 ENCOUNTER — Other Ambulatory Visit: Payer: Self-pay

## 2022-11-04 DIAGNOSIS — Z125 Encounter for screening for malignant neoplasm of prostate: Secondary | ICD-10-CM

## 2022-11-04 DIAGNOSIS — E1169 Type 2 diabetes mellitus with other specified complication: Secondary | ICD-10-CM

## 2022-11-04 DIAGNOSIS — Z0000 Encounter for general adult medical examination without abnormal findings: Secondary | ICD-10-CM

## 2022-11-04 DIAGNOSIS — I10 Essential (primary) hypertension: Secondary | ICD-10-CM

## 2022-11-05 LAB — CBC WITH DIFFERENTIAL/PLATELET
Absolute Monocytes: 474 cells/uL (ref 200–950)
Basophils Absolute: 70 cells/uL (ref 0–200)
Basophils Relative: 1.1 %
Eosinophils Absolute: 192 cells/uL (ref 15–500)
Eosinophils Relative: 3 %
HCT: 45.5 % (ref 38.5–50.0)
Hemoglobin: 15.1 g/dL (ref 13.2–17.1)
Lymphs Abs: 2035 cells/uL (ref 850–3900)
MCH: 30.3 pg (ref 27.0–33.0)
MCHC: 33.2 g/dL (ref 32.0–36.0)
MCV: 91.4 fL (ref 80.0–100.0)
MPV: 10.1 fL (ref 7.5–12.5)
Monocytes Relative: 7.4 %
Neutro Abs: 3629 cells/uL (ref 1500–7800)
Neutrophils Relative %: 56.7 %
Platelets: 323 10*3/uL (ref 140–400)
RBC: 4.98 10*6/uL (ref 4.20–5.80)
RDW: 12.4 % (ref 11.0–15.0)
Total Lymphocyte: 31.8 %
WBC: 6.4 10*3/uL (ref 3.8–10.8)

## 2022-11-05 LAB — LIPID PANEL
Cholesterol: 168 mg/dL (ref <200)
HDL: 45 mg/dL (ref >=40)
LDL Cholesterol (Calc): 100 mg/dL (calc) — ABNORMAL HIGH
Non-HDL Cholesterol (Calc): 123 mg/dL (calc) (ref <130)
Total CHOL/HDL Ratio: 3.7 (calc) (ref <5.0)
Triglycerides: 125 mg/dL (ref <150)

## 2022-11-05 LAB — COMPLETE METABOLIC PANEL WITH GFR
AG Ratio: 1.6 (calc) (ref 1.0–2.5)
ALT: 46 U/L (ref 9–46)
AST: 22 U/L (ref 10–40)
Albumin: 4.2 g/dL (ref 3.6–5.1)
Alkaline phosphatase (APISO): 51 U/L (ref 36–130)
BUN: 17 mg/dL (ref 7–25)
CO2: 25 mmol/L (ref 20–32)
Calcium: 9.5 mg/dL (ref 8.6–10.3)
Chloride: 104 mmol/L (ref 98–110)
Creat: 0.96 mg/dL (ref 0.60–1.29)
Globulin: 2.6 g/dL (calc) (ref 1.9–3.7)
Glucose, Bld: 196 mg/dL — ABNORMAL HIGH (ref 65–99)
Potassium: 4.9 mmol/L (ref 3.5–5.3)
Sodium: 140 mmol/L (ref 135–146)
Total Bilirubin: 0.4 mg/dL (ref 0.2–1.2)
Total Protein: 6.8 g/dL (ref 6.1–8.1)
eGFR: 97 mL/min/{1.73_m2} (ref >=60)

## 2022-11-05 LAB — HEMOGLOBIN A1C
Hgb A1c MFr Bld: 7.7 % of total Hgb — ABNORMAL HIGH (ref <5.7)
Mean Plasma Glucose: 174 mg/dL
eAG (mmol/L): 9.7 mmol/L

## 2022-11-05 LAB — PSA: PSA: 0.5 ng/mL (ref <=4.00)

## 2022-11-05 LAB — TSH: TSH: 1.65 mIU/L (ref 0.40–4.50)

## 2022-11-11 ENCOUNTER — Other Ambulatory Visit: Payer: No Typology Code available for payment source

## 2022-11-18 ENCOUNTER — Encounter: Payer: No Typology Code available for payment source | Admitting: Family Medicine

## 2022-11-23 ENCOUNTER — Ambulatory Visit (INDEPENDENT_AMBULATORY_CARE_PROVIDER_SITE_OTHER): Payer: No Typology Code available for payment source | Admitting: Family Medicine

## 2022-11-23 ENCOUNTER — Encounter: Payer: Self-pay | Admitting: Family Medicine

## 2022-11-23 VITALS — BP 126/74 | HR 93 | Temp 97.9°F | Resp 17 | Ht 75.0 in | Wt 294.8 lb

## 2022-11-23 DIAGNOSIS — E785 Hyperlipidemia, unspecified: Secondary | ICD-10-CM

## 2022-11-23 DIAGNOSIS — Z0000 Encounter for general adult medical examination without abnormal findings: Secondary | ICD-10-CM

## 2022-11-23 DIAGNOSIS — E1169 Type 2 diabetes mellitus with other specified complication: Secondary | ICD-10-CM | POA: Diagnosis not present

## 2022-11-23 DIAGNOSIS — I10 Essential (primary) hypertension: Secondary | ICD-10-CM

## 2022-11-23 MED ORDER — MOUNJARO 10 MG/0.5ML ~~LOC~~ SOAJ: 10.0000 mg | SUBCUTANEOUS | 1 refills | Status: DC

## 2022-11-23 MED ORDER — PRAVASTATIN SODIUM 20 MG PO TABS: 20.0000 mg | ORAL_TABLET | Freq: Every day | ORAL | 3 refills | Status: DC

## 2022-11-23 MED ORDER — METFORMIN HCL 1000 MG PO TABS: 1000.0000 mg | ORAL_TABLET | Freq: Two times a day (BID) | ORAL | 1 refills | Status: DC

## 2022-11-23 NOTE — Progress Notes (Unsigned)
Subjective:    Patient ID: Colton Long, male    DOB: 04-Jul-1975, 47 y.o.   MRN: [id number]  Colton Long is a 47 y.o. male presenting on 11/23/2022 for Annual Exam   HPI  CHRONIC DM, Type 2 / MORBID OBESITY BMI 36 Today reports doing very well. A1c previously 6-7 range Last A1c result now up to 7.7, no clear reason why it was elevated. He has done well overall Prior history on Ozempic 2mg  has switched to [facility] 6 months ago and dose increased CBG - avg sugar improved Meds: Mounjaro 7.5mg  weekly  inj (Saturday), Metformin 1000mg  BID Reports good compliance. Tolerating well w/o side-effects Currently on ARB (Losartan) Lifestyle: Wt up to 296 lbs - Improving carb control DM diet, following a similar ketogenic diet - Dr Clydene Pugh DM Eye, last visit 02/15/22 updated - Denies hypoglycemia   CHRONIC HTN: Reports doing well without concern Current Meds - Losartan 50mg  daily   Reports good compliance, took meds today. Tolerating well, w/o complaints. Denies CP, dyspnea, HA, edema, dizziness / lightheadedness   Chronic Anxiety and Intermittent Stress - Prior history of anxiety >15-20 years ago, treated with Paxil in past,  - Currently taking Fluoxetine 20mg  daily, had been on from his prior PCP for several years ago, with good results Refill due today     Health Maintenance: Recommend Cologuard order, he has kit, he will complete it once confirms coverage     11/23/2022    8:19 AM 05/12/2022    8:55 AM 03/11/2022    9:48 AM  Depression screen PHQ 2/9  Decreased Interest 0 0 0  Down, Depressed, Hopeless 0 0 0  PHQ - 2 Score 0 0 0  Altered sleeping 0 0 0  Tired, decreased energy 0 0 0  Change in appetite 0 0 0  Feeling bad or failure about yourself  0 0 0  Trouble concentrating 0 0 0  Moving slowly or fidgety/restless 0 0 0  Suicidal thoughts 0 0 0  PHQ-9 Score 0 0 0  Difficult doing work/chores Not difficult at all Not difficult at all Not difficult at all     Past Medical History:  Diagnosis Date   Hypertension    Sleep apnea    Past Surgical History:  Procedure Laterality Date   CYSTECTOMY  2006,2011   head    NASAL SEPTUM SURGERY  2005   skin lesion exicision  2012   scalp Dr.[facility]   Social History   Socioeconomic History   Marital status: Married    Spouse name: Not on file   Number of children: Not on file   Years of education: Not on file   Highest education level: Not on file  Occupational History   Occupation: Emergency planning/management officer  Tobacco Use   Smoking status: Never   Smokeless tobacco: Never  Vaping Use   Vaping Use: Never used  Substance and Sexual Activity   Alcohol use: Yes    Alcohol/week: 0.0 standard drinks of alcohol    Comment: occasional   Drug use: No   Sexual activity: Yes  Other Topics Concern   Not on file  Social History Narrative   Not on file   Social Determinants of Health   Financial Resource Strain: Not on file  Food Insecurity: Not on file  Transportation Needs: Not on file  Physical Activity: Not on file  Stress: Not on file  Social Connections: Not on file  Intimate Partner Violence: Not on file  Family History  Problem Relation Age of Onset   Hypertension Mother    Diabetes Mother    Cancer Father        lung   Heart disease Father    Hypertension Father    Diabetes Father    Prostate cancer Paternal Uncle    Colon cancer Neg Hx    Current Outpatient Medications on File Prior to Visit  Medication Sig   Blood Glucose Monitoring Suppl (ONE TOUCH ULTRA SYSTEM KIT) w/Device KIT 1 kit by Does not apply route once.   Cholecalciferol (VITAMIN D-3 PO) Take 2,000 Int'l Units by mouth daily.   FLUoxetine (PROZAC) 20 MG capsule TAKE 1 CAPSULE(20 MG) BY MOUTH DAILY   fluticasone (FLONASE) 50 MCG/ACT nasal spray Place 2 sprays into both nostrils daily. Use for 4-6 weeks then stop and use seasonally or as needed.   glucose blood test strip 1 each by Other route as needed for  other. Use as instructed   Insulin Pen Needle (INSUPEN PEN NEEDLES) 32G X 4 MM MISC Use with Ozempic pen inject weekly   losartan (COZAAR) 50 MG tablet TAKE 1 TABLET(50 MG) BY MOUTH DAILY   mupirocin ointment (BACTROBAN) 2 % Apply 1 Application topically 2 (two) times daily. For 1-2 weeks as needed for skin sores.   No current facility-administered medications on file prior to visit.    Review of Systems  Constitutional:  Negative for activity change, appetite change, chills, diaphoresis, fatigue and fever.  HENT:  Negative for congestion and hearing loss.   Eyes:  Negative for visual disturbance.  Respiratory:  Negative for cough, chest tightness, shortness of breath and wheezing.   Cardiovascular:  Negative for chest pain, palpitations and leg swelling.  Gastrointestinal:  Negative for abdominal pain, constipation, diarrhea, nausea and vomiting.  Genitourinary:  Negative for dysuria, frequency and hematuria.  Musculoskeletal:  Negative for arthralgias and neck pain.  Skin:  Negative for rash.  Neurological:  Negative for dizziness, weakness, light-headedness, numbness and headaches.  Hematological:  Negative for adenopathy.  Psychiatric/Behavioral:  Negative for behavioral problems, dysphoric mood and sleep disturbance.    Per HPI unless specifically indicated above      Objective:    BP 126/74   Pulse 93   Temp 97.9 F (36.6 C) (Oral)   Resp 17   Ht 6\' 3"  (1.905 m)   Wt 294 lb 12.8 oz (133.7 kg)   SpO2 98%   BMI 36.85 kg/m   Wt Readings from Last 3 Encounters:  11/23/22 294 lb 12.8 oz (133.7 kg)  05/12/22 288 lb (130.6 kg)  03/15/22 290 lb 12.6 oz (131.9 kg)    Physical Exam Vitals and nursing note reviewed.  Constitutional:      General: He is not in acute distress.    Appearance: He is well-developed. He is obese. He is not diaphoretic.     Comments: Well-appearing, comfortable, cooperative  HENT:     Head: Normocephalic and atraumatic.  Eyes:     General:         Right eye: No discharge.        Left eye: No discharge.     Conjunctiva/sclera: Conjunctivae normal.     Pupils: Pupils are equal, round, and reactive to light.  Neck:     Thyroid: No thyromegaly.     Vascular: No carotid bruit.  Cardiovascular:     Rate and Rhythm: Normal rate and regular rhythm.     Pulses: Normal pulses.  Heart sounds: Normal heart sounds. No murmur heard. Pulmonary:     Effort: Pulmonary effort is normal. No respiratory distress.     Breath sounds: Normal breath sounds. No wheezing or rales.  Abdominal:     General: Bowel sounds are normal. There is no distension.     Palpations: Abdomen is soft. There is no mass.     Tenderness: There is no abdominal tenderness.  Musculoskeletal:        General: No tenderness. Normal range of motion.     Cervical back: Normal range of motion and neck supple.     Right lower leg: No edema.     Left lower leg: No edema.     Comments: Upper / Lower Extremities: - Normal muscle tone, strength bilateral upper extremities 5/5, lower extremities 5/5  Lymphadenopathy:     Cervical: No cervical adenopathy.  Skin:    General: Skin is warm and dry.     Findings: No erythema or rash.  Neurological:     Mental Status: He is alert and oriented to person, place, and time.     Comments: Distal sensation intact to light touch all extremities  Psychiatric:        Mood and Affect: Mood normal.        Behavior: Behavior normal.        Thought Content: Thought content normal.     Comments: Well groomed, good eye contact, normal speech and thoughts     Diabetic Foot Exam - Simple   Simple Foot Form Diabetic Foot exam was performed with the following findings: Yes 11/23/2022  8:24 AM  Visual Inspection See comments: Yes Sensation Testing Intact to touch and monofilament testing bilaterally: Yes Pulse Check Posterior Tibialis and Dorsalis pulse intact bilaterally: Yes Comments Mild callus formation heels and forefoot. No  ulceration. Sensation intact.      Results for orders placed or performed in visit on 11/04/22  PSA  Result Value Ref Range   PSA 0.50 < OR = 4.00 ng/mL  Hemoglobin A1c  Result Value Ref Range   Hgb A1c MFr Bld 7.7 (H) <5.7 % of total Hgb   Mean Plasma Glucose 174 mg/dL   eAG (mmol/L) 9.7 mmol/L  TSH  Result Value Ref Range   TSH 1.65 0.40 - 4.50 mIU/L  Lipid panel  Result Value Ref Range   Cholesterol 168 <200 mg/dL   HDL 45 > OR = 40 mg/dL   Triglycerides 161 <096 mg/dL   LDL Cholesterol (Calc) 100 (H) mg/dL (calc)   Total CHOL/HDL Ratio 3.7 <5.0 (calc)   Non-HDL Cholesterol (Calc) 123 <130 mg/dL (calc)  CBC with Differential/Platelet  Result Value Ref Range   WBC 6.4 3.8 - 10.8 Thousand/uL   RBC 4.98 4.20 - 5.80 Million/uL   Hemoglobin 15.1 13.2 - 17.1 g/dL   HCT 04.5 40.9 - 81.1 %   MCV 91.4 80.0 - 100.0 fL   MCH 30.3 27.0 - 33.0 pg   MCHC 33.2 32.0 - 36.0 g/dL   RDW 91.4 78.2 - 95.6 %   Platelets 323 140 - 400 Thousand/uL   MPV 10.1 7.5 - 12.5 fL   Neutro Abs 3,629 1,500 - 7,800 cells/uL   Lymphs Abs 2,035 850 - 3,900 cells/uL   Absolute Monocytes 474 200 - 950 cells/uL   Eosinophils Absolute 192 15 - 500 cells/uL   Basophils Absolute 70 0 - 200 cells/uL   Neutrophils Relative % 56.7 %   Total Lymphocyte 31.8 %  Monocytes Relative 7.4 %   Eosinophils Relative 3.0 %   Basophils Relative 1.1 %  COMPLETE METABOLIC PANEL WITH GFR  Result Value Ref Range   Glucose, Bld 196 (H) 65 - 99 mg/dL   BUN 17 7 - 25 mg/dL   Creat 1.61 0.96 - 0.45 mg/dL   eGFR 97 > OR = 60 WU/JWJ/1.91Y7   BUN/Creatinine Ratio SEE NOTE: 6 - 22 (calc)   Sodium 140 135 - 146 mmol/L   Potassium 4.9 3.5 - 5.3 mmol/L   Chloride 104 98 - 110 mmol/L   CO2 25 20 - 32 mmol/L   Calcium 9.5 8.6 - 10.3 mg/dL   Total Protein 6.8 6.1 - 8.1 g/dL   Albumin 4.2 3.6 - 5.1 g/dL   Globulin 2.6 1.9 - 3.7 g/dL (calc)   AG Ratio 1.6 1.0 - 2.5 (calc)   Total Bilirubin 0.4 0.2 - 1.2 mg/dL   Alkaline  phosphatase (APISO) 51 36 - 130 U/L   AST 22 10 - 40 U/L   ALT 46 9 - 46 U/L      Assessment & Plan:   Problem List Items Addressed This Visit     Essential hypertension    Well-controlled HTN - low normal - Home BP readings normal  No known complications     Plan:  1. Continue Losartan 50mg  daily 2. Encourage improved lifestyle - low sodium diet, regular exercise 3. Continue monitor BP outside office, bring readings to next visit, if persistently >140/90 or new symptoms notify office sooner      Relevant Medications   pravastatin (PRAVACHOL) 20 MG tablet   Hyperlipidemia associated with type 2 diabetes mellitus (HCC)    Controlled cholesterol on pravastatin - with lifestyle improvements Last lipid panel 10/2022 mild elevated LDL 100 but overall controlled  Plan: 1. Continue current meds - Pravastatin 20mg  daily 2. Encourage improved lifestyle - low carb/cholesterol, reduce portion size, continue improving regular exercise 3. Follow-up yearly lipids      Relevant Medications   MOUNJARO 10 MG/0.5ML Pen   metFORMIN (GLUCOPHAGE) 1000 MG tablet   pravastatin (PRAVACHOL) 20 MG tablet   Morbid obesity (HCC)    Comorbid factors BMI < 40, with BMI 36 Notable weight loss T2DM, HTN HLD On GLP1      Relevant Medications   MOUNJARO 10 MG/0.5ML Pen   metFORMIN (GLUCOPHAGE) 1000 MG tablet   Type 2 diabetes mellitus with other specified complication (HCC)    Mild elevated A1c to 7.7 now up from 6.4 Some wt gain, uncertain reason Complicated by HTN. HLD. Morbid obesity. Failed SGLT2 (jardiance - UTI), Actos  Plan:  Dose increase Mounjaro 7.5 up to 10mg  weekly inj Continue Metformin 1000mg  BID for now still, goal to taper down - Encouraged continue work on implementing healthy DM diet, limited portion, reviewed reduced carb plan, continue improve hydration, continue regular exercise, treadmill Continue ARB, statin DM Foot exam DM Eye 9.2024 [place] Urine microalbumin       Relevant Medications   MOUNJARO 10 MG/0.5ML Pen   metFORMIN (GLUCOPHAGE) 1000 MG tablet   pravastatin (PRAVACHOL) 20 MG tablet   Other Relevant Orders   Urine Microalbumin w/creat. ratio   Other Visit Diagnoses     Annual physical exam    -  Primary       Updated Health Maintenance information Reviewed recent lab results with patient Encouraged improvement to lifestyle with diet and exercise Goal of weight loss   Meds ordered this encounter  Medications  MOUNJARO 10 MG/0.5ML Pen    Sig: Inject 10 mg into the skin once a week.    Dispense:  6 mL    Refill:  1    Dose increase from Mounjaro 7.5 up to 10mg    metFORMIN (GLUCOPHAGE) 1000 MG tablet    Sig: Take 1 tablet (1,000 mg total) by mouth 2 (two) times daily with a meal.    Dispense:  180 tablet    Refill:  1    Add refills   pravastatin (PRAVACHOL) 20 MG tablet    Sig: Take 1 tablet (20 mg total) by mouth at bedtime.    Dispense:  90 tablet    Refill:  3     Follow up plan: Return in about 6 months (around 05/25/2023) for 6 month DM A1c.  Saralyn Pilar, DO [facility] 11/23/2022, 8:13 AM

## 2022-11-23 NOTE — Assessment & Plan Note (Signed)
Mild elevated A1c to 7.7 now up from 6.4 Some wt gain, uncertain reason Complicated by HTN. HLD. Morbid obesity. Failed SGLT2 (jardiance - UTI), Actos  Plan:  Dose increase Mounjaro 7.5 up to 10mg  weekly inj Continue Metformin 1000mg  BID for now still, goal to taper down - Encouraged continue work on implementing healthy DM diet, limited portion, reviewed reduced carb plan, continue improve hydration, continue regular exercise, treadmill Continue ARB, statin DM Foot exam DM Eye 9.2024 [place] Urine microalbumin

## 2022-11-23 NOTE — Assessment & Plan Note (Signed)
Controlled cholesterol on pravastatin - with lifestyle improvements Last lipid panel 10/2022 mild elevated LDL 100 but overall controlled  Plan: 1. Continue current meds - Pravastatin 20mg  daily 2. Encourage improved lifestyle - low carb/cholesterol, reduce portion size, continue improving regular exercise 3. Follow-up yearly lipids

## 2022-11-23 NOTE — Assessment & Plan Note (Signed)
Well-controlled HTN - low normal °- Home BP readings normal  °No known complications  °  ° °Plan:  °1. Continue Losartan 50mg daily °2. Encourage improved lifestyle - low sodium diet, regular exercise °3. Continue monitor BP outside office, bring readings to next visit, if persistently >140/90 or new symptoms notify office sooner °

## 2022-11-23 NOTE — Assessment & Plan Note (Signed)
Comorbid factors BMI < 40, with BMI 36 Notable weight loss T2DM, HTN HLD On GLP1 

## 2022-11-23 NOTE — Patient Instructions (Addendum)
Thank you for coming to the office today.  Double check cost coverage Cologuard. Send back when confirmed.  Dose increase Mounjaro 7.5 up to 10mg  weekly inj  Future we can taper down Metformin.  Refilled Cholesterol medication  Mild elevated LDL 100, similar to before  Recent Labs    05/12/22 0815 11/04/22 0819  HGBA1C 6.4* 7.7*   Recommend Diabetic Eye exam from Dr Clydene Pugh Sept 2024  Send [place] copy of report to fax [phone]  Urine test today for protein / kidney function   Please schedule a Follow-up Appointment to: Return in about 6 months (around 05/25/2023) for 6 month DM A1c.  If you have any other questions or concerns, please feel free to call the office or send a message through MyChart. You may also schedule an earlier appointment if necessary.  Additionally, you may be receiving a survey about your experience at our office within a few days to 1 week by e-mail or mail. We value your feedback.  Saralyn Pilar, DO [facility], [place]

## 2022-11-24 ENCOUNTER — Other Ambulatory Visit: Payer: Self-pay | Admitting: Family Medicine

## 2022-11-24 DIAGNOSIS — E1169 Type 2 diabetes mellitus with other specified complication: Secondary | ICD-10-CM

## 2022-11-24 LAB — MICROALBUMIN / CREATININE URINE RATIO
Creatinine, Urine: 179 mg/dL (ref 20–320)
Microalb Creat Ratio: 26 mg/g creat (ref <30)
Microalb, Ur: 4.6 mg/dL

## 2022-11-25 NOTE — Telephone Encounter (Signed)
Unable to refill per protocol, Rx request last refilled 11/23/22 for 90 days.E-Prescribing Status: Receipt confirmed by pharmacy (11/23/2022  8:22 AM EDT).  Requested Prescriptions  Pending Prescriptions Disp Refills   pravastatin (PRAVACHOL) 20 MG tablet [Pharmacy Med Name: PRAVASTATIN 20MG  TABLETS] 90 tablet 3    Sig: TAKE 1 TABLET(20 MG) BY MOUTH AT BEDTIME     Cardiovascular:  Antilipid - Statins Failed - 11/24/2022  7:08 AM      Failed - Lipid Panel in normal range within the last 12 months    Cholesterol, Total  Date Value Ref Range Status  03/23/2015 175 100 - 199 mg/dL Final   Cholesterol  Date Value Ref Range Status  11/04/2022 168 <200 mg/dL Final   LDL Cholesterol (Calc)  Date Value Ref Range Status  11/04/2022 100 (H) mg/dL (calc) Final    Comment:    Reference range: <100 . Desirable range <100 mg/dL for primary prevention;   <70 mg/dL for patients with CHD or diabetic patients  with > or = 2 CHD risk factors. Marland Kitchen LDL-C is now calculated using the Martin-Hopkins  calculation, which is a validated novel method providing  better accuracy than the Friedewald equation in the  estimation of LDL-C.  Horald Pollen et al. Lenox Ahr. 1610;960(45): 2061-2068  (http://education.[employer].com/faq/FAQ164)    HDL  Date Value Ref Range Status  11/04/2022 45 > OR = 40 mg/dL Final  40/98/1191 45 >47 mg/dL Final    Comment:    According to ATP-III Guidelines, HDL-C >59 mg/dL is considered a negative risk factor for CHD.    Triglycerides  Date Value Ref Range Status  11/04/2022 125 <150 mg/dL Final         Passed - Patient is not pregnant      Passed - Valid encounter within last 12 months    Recent Outpatient Visits           2 days ago Annual physical exam   [place] [facility] [place], Colton Neat, DO   6 months ago Type 2 diabetes mellitus with other specified complication, without long-term current use of insulin [facility])   [place] [facility] [place], Colton Neat, DO   8 months ago Abscess of right thigh   [place] [facility], Oswaldo Conroy, [place]   1 year ago Annual physical exam   [place] [facility] Smitty Cords, DO   1 year ago Type 2 diabetes mellitus with other specified complication, without long-term current use of insulin (HCC)   [place] [facility] [place], Colton Neat, DO       Future Appointments             In 6 months Althea Charon, Colton Neat, DO  [facility]             metFORMIN (GLUCOPHAGE) 1000 MG tablet [Pharmacy Med Name: METFORMIN 1000MG  TABLETS] 180 tablet 1    Sig: TAKE 1 TABLET(1000 MG) BY MOUTH TWICE DAILY WITH A MEAL     Endocrinology:  Diabetes - Biguanides Failed - 11/24/2022  7:08 AM      Failed - B12 Level in normal range and within 720 days    No results found for: "VITAMINB12"       Passed - Cr in normal range and within 360 days    Creat  Date Value Ref Range Status  11/04/2022 0.96 0.60 - 1.29 mg/dL Final  Creatinine, Urine  Date Value Ref Range Status  11/23/2022 179 20 - 320 mg/dL Final         Passed - HBA1C is between 0 and 7.9 and within 180 days    Hemoglobin A1C  Date Value Ref Range Status  12/27/2016 6.2  Final    Comment:    Employee health screening from work   Hgb A1c MFr Bld  Date Value Ref Range Status  11/04/2022 7.7 (H) <5.7 % of total Hgb Final    Comment:    For someone without known diabetes, a hemoglobin A1c value of 6.5% or greater indicates that they may have  diabetes and this should be confirmed with a follow-up  test. . For someone with known diabetes, a value <7% indicates  that their diabetes is well controlled and a value  greater than or equal to 7% indicates suboptimal  control. A1c targets should be individualized based on  duration of diabetes, age, comorbid conditions, and  other  considerations. . Currently, no consensus exists regarding use of hemoglobin A1c for diagnosis of diabetes for children. .          Passed - eGFR in normal range and within 360 days    GFR, Est African American  Date Value Ref Range Status  11/04/2020 127 > OR = 60 mL/min/1.46m2 Final   GFR, Est Non African American  Date Value Ref Range Status  11/04/2020 110 > OR = 60 mL/min/1.80m2 Final   eGFR  Date Value Ref Range Status  11/04/2022 97 > OR = 60 mL/min/1.63m2 Final         Passed - Valid encounter within last 6 months    Recent Outpatient Visits           2 days ago Annual physical exam   [place] [facility] [place], Colton Neat, DO   6 months ago Type 2 diabetes mellitus with other specified complication, without long-term current use of insulin [facility])   [place] [facility] [place], Colton Neat, DO   8 months ago Abscess of right thigh   [place] [facility], Oswaldo Conroy, [place]   1 year ago Annual physical exam   [place] [facility] [place], Colton Neat, DO   1 year ago Type 2 diabetes mellitus with other specified complication, without long-term current use of insulin (HCC)   [place] [facility] [place], Colton Neat, DO       Future Appointments             In 6 months Althea Charon, Colton Neat, DO [place] [facility]            Passed - CBC within normal limits and completed in the last 12 months    WBC  Date Value Ref Range Status  11/04/2022 6.4 3.8 - 10.8 Thousand/uL Final   RBC  Date Value Ref Range Status  11/04/2022 4.98 4.20 - 5.80 Million/uL Final   Hemoglobin  Date Value Ref Range Status  11/04/2022 15.1 13.2 - 17.1 g/dL Final   HCT  Date Value Ref Range Status  11/04/2022 45.5 38.5 - 50.0 % Final   MCHC  Date Value Ref Range Status  11/04/2022 33.2 32.0 - 36.0 g/dL Final   [facility]  Date Value Ref  Range Status  11/04/2022 30.3 27.0 - 33.0 pg Final   MCV  Date Value Ref Range Status  11/04/2022 91.4  80.0 - 100.0 fL Final   No results found for: "PLTCOUNTKUC", "LABPLAT", "POCPLA" RDW  Date Value Ref Range Status  11/04/2022 12.4 11.0 - 15.0 % Final

## 2023-01-05 ENCOUNTER — Other Ambulatory Visit: Payer: Self-pay | Admitting: Family Medicine

## 2023-01-05 DIAGNOSIS — I10 Essential (primary) hypertension: Secondary | ICD-10-CM

## 2023-01-06 NOTE — Telephone Encounter (Signed)
Requested Prescriptions  Pending Prescriptions Disp Refills   losartan (COZAAR) 50 MG tablet [Pharmacy Med Name: LOSARTAN 50MG  TABLETS] 90 tablet 1    Sig: TAKE 1 TABLET(50 MG) BY MOUTH DAILY     Cardiovascular:  Angiotensin Receptor Blockers Passed - 01/05/2023  8:20 PM      Passed - Cr in normal range and within 180 days    Creat  Date Value Ref Range Status  11/04/2022 0.96 0.60 - 1.29 mg/dL Final   Creatinine, Urine  Date Value Ref Range Status  11/23/2022 179 20 - 320 mg/dL Final         Passed - K in normal range and within 180 days    Potassium  Date Value Ref Range Status  11/04/2022 4.9 3.5 - 5.3 mmol/L Final         Passed - Patient is not pregnant      Passed - Last BP in normal range    BP Readings from Last 1 Encounters:  11/23/22 126/74         Passed - Valid encounter within last 6 months    Recent Outpatient Visits           1 month ago Annual physical exam   [place] [facility] [place], Netta Neat, DO   7 months ago Type 2 diabetes mellitus with other specified complication, without long-term current use of insulin [facility])   [place] [facility] [place], Netta Neat, DO   10 months ago Abscess of right thigh   [place] [facility], Oswaldo Conroy, [place]   1 year ago Annual physical exam   [place] [facility] Smitty Cords, DO   1 year ago Type 2 diabetes mellitus with other specified complication, without long-term current use of insulin [facility])   [place] [facility] [place], Netta Neat, DO       Future Appointments             In 5 months Althea Charon, Netta Neat, DO  [facility], [facility]

## 2023-05-23 ENCOUNTER — Other Ambulatory Visit: Payer: Self-pay | Admitting: Family Medicine

## 2023-05-23 DIAGNOSIS — E1169 Type 2 diabetes mellitus with other specified complication: Secondary | ICD-10-CM

## 2023-05-23 NOTE — Telephone Encounter (Signed)
Requested Prescriptions  Pending Prescriptions Disp Refills   metFORMIN (GLUCOPHAGE) 1000 MG tablet [Pharmacy Med Name: METFORMIN 1000MG  TABLETS] 180 tablet 0    Sig: TAKE 1 TABLET(1000 MG) BY MOUTH TWICE DAILY WITH A MEAL     Endocrinology:  Diabetes - Biguanides Failed - 05/23/2023  3:36 PM      Failed - HBA1C is between 0 and 7.9 and within 180 days    Hemoglobin A1C  Date Value Ref Range Status  12/27/2016 6.2  Final    Comment:    Employee health screening from work   Hgb A1c MFr Bld  Date Value Ref Range Status  11/04/2022 7.7 (H) <5.7 % of total Hgb Final    Comment:    For someone without known diabetes, a hemoglobin A1c value of 6.5% or greater indicates that they may have  diabetes and this should be confirmed with a follow-up  test. . For someone with known diabetes, a value <7% indicates  that their diabetes is well controlled and a value  greater than or equal to 7% indicates suboptimal  control. A1c targets should be individualized based on  duration of diabetes, age, comorbid conditions, and  other considerations. . Currently, no consensus exists regarding use of hemoglobin A1c for diagnosis of diabetes for children. .          Failed - B12 Level in normal range and within 720 days    No results found for: "VITAMINB12"       Failed - Valid encounter within last 6 months    Recent Outpatient Visits           6 months ago Annual physical exam   [place] [facility], Netta Neat, DO   1 year ago Type 2 diabetes mellitus with other specified complication, without long-term current use of insulin [facility])   [place] Smitty Cords, DO   1 year ago Abscess of right thigh   [place] [facility] Mecum, Oswaldo [place], [place]   1 year ago Annual physical exam   [place] [facility] Smitty Cords, DO   2 years ago Type 2 diabetes mellitus with other  specified complication, without long-term current use of insulin (HCC)   [place] [facility] [place], Netta Neat, DO       Future Appointments             In 2 weeks [place] Charon, Netta Neat, DO  [facility], [facility]            Passed - Cr in normal range and within 360 days    Creat  Date Value Ref Range Status  11/04/2022 0.96 0.60 - 1.29 mg/dL Final   Creatinine, Urine  Date Value Ref Range Status  11/23/2022 179 20 - 320 mg/dL Final         Passed - eGFR in normal range and within 360 days    GFR, Est African American  Date Value Ref Range Status  11/04/2020 127 > OR = 60 mL/min/1.45m2 Final   GFR, Est Non African American  Date Value Ref Range Status  11/04/2020 110 > OR = 60 mL/min/1.19m2 Final   eGFR  Date Value Ref Range Status  11/04/2022 97 > OR = 60 mL/min/1.49m2 Final         Passed - CBC within normal limits and completed in the last 12 months  WBC  Date Value Ref Range Status  11/04/2022 6.4 3.8 - 10.8 Thousand/uL Final   RBC  Date Value Ref Range Status  11/04/2022 4.98 4.20 - 5.80 Million/uL Final   Hemoglobin  Date Value Ref Range Status  11/04/2022 15.1 13.2 - 17.1 g/dL Final   HCT  Date Value Ref Range Status  11/04/2022 45.5 38.5 - 50.0 % Final   MCHC  Date Value Ref Range Status  11/04/2022 33.2 32.0 - 36.0 g/dL Final   [facility]  Date Value Ref Range Status  11/04/2022 30.3 27.0 - 33.0 pg Final   MCV  Date Value Ref Range Status  11/04/2022 91.4 80.0 - 100.0 fL Final   No results found for: "PLTCOUNTKUC", "LABPLAT", "POCPLA" RDW  Date Value Ref Range Status  11/04/2022 12.4 11.0 - 15.0 % Final

## 2023-05-29 ENCOUNTER — Other Ambulatory Visit: Payer: Self-pay | Admitting: Family Medicine

## 2023-05-29 DIAGNOSIS — F419 Anxiety disorder, unspecified: Secondary | ICD-10-CM

## 2023-06-01 NOTE — Telephone Encounter (Signed)
 Requested Prescriptions  Pending Prescriptions Disp Refills   FLUoxetine  (PROZAC ) 20 MG capsule [Pharmacy Med Name: FLUOXETINE  20MG  CAPSULES] 90 capsule 3    Sig: TAKE 1 CAPSULE(20 MG) BY MOUTH DAILY     Psychiatry:  Antidepressants - SSRI Failed - 06/01/2023  5:36 PM      Failed - Valid encounter within last 6 months    Recent Outpatient Visits           6 months ago Annual physical exam   [place] [facility] [place], Marsa PARAS, DO   1 year ago Type 2 diabetes mellitus with other specified complication, without long-term current use of insulin  [facility])   [place] [facility] Edman Marsa PARAS, DO   1 year ago Abscess of right thigh   [place] [facility] Mecum, Rocky BRAVO, [place]   1 year ago Annual physical exam   [facility] [place], Marsa PARAS, DO   2 years ago Type 2 diabetes mellitus with other specified complication, without long-term current use of insulin  [facility])   [place] [facility] [place], Marsa PARAS, DO       Future Appointments             In 6 days Edman, Marsa PARAS, DO [place], [facility]

## 2023-06-07 ENCOUNTER — Other Ambulatory Visit: Payer: Self-pay | Admitting: Family Medicine

## 2023-06-07 ENCOUNTER — Ambulatory Visit: Payer: 59 | Admitting: Family Medicine

## 2023-06-07 ENCOUNTER — Encounter: Payer: Self-pay | Admitting: Family Medicine

## 2023-06-07 VITALS — BP 110/78 | HR 101 | Ht 75.0 in | Wt 299.0 lb

## 2023-06-07 DIAGNOSIS — E1169 Type 2 diabetes mellitus with other specified complication: Secondary | ICD-10-CM

## 2023-06-07 DIAGNOSIS — Z0000 Encounter for general adult medical examination without abnormal findings: Secondary | ICD-10-CM

## 2023-06-07 DIAGNOSIS — F419 Anxiety disorder, unspecified: Secondary | ICD-10-CM

## 2023-06-07 DIAGNOSIS — I10 Essential (primary) hypertension: Secondary | ICD-10-CM

## 2023-06-07 DIAGNOSIS — Z125 Encounter for screening for malignant neoplasm of prostate: Secondary | ICD-10-CM

## 2023-06-07 LAB — POCT GLYCOSYLATED HEMOGLOBIN (HGB A1C): Hemoglobin A1C: 8.2 % — AB (ref 4.0–5.6)

## 2023-06-07 MED ORDER — METFORMIN HCL 1000 MG PO TABS: 1000.0000 mg | ORAL_TABLET | Freq: Two times a day (BID) | ORAL | 3 refills | Status: DC

## 2023-06-07 MED ORDER — MOUNJARO 15 MG/0.5ML ~~LOC~~ SOAJ: 15.0000 mg | SUBCUTANEOUS | 1 refills | Status: DC

## 2023-06-07 MED ORDER — LOSARTAN POTASSIUM 50 MG PO TABS: 50.0000 mg | ORAL_TABLET | Freq: Every day | ORAL | 3 refills | Status: DC

## 2023-06-07 MED ORDER — FLUOXETINE HCL 20 MG PO CAPS: 20.0000 mg | ORAL_CAPSULE | Freq: Every day | ORAL | 3 refills | Status: DC

## 2023-06-07 NOTE — Patient Instructions (Addendum)
 Thank you for coming to the office today.  Recent Labs    11/04/22 0819 06/07/23 0817  HGBA1C 7.7* 8.2*   Dose increase Mounjaro  10 up to 15 mg weekly  Please look into the Continuous Glucose Monitor CGM  Dexcom Stelo (OTC no rx required $75-90) / or we can consider ordering Dexcom G7 or Freestyle Libre, cost is around $100-150  Colon Cancer Screening: - For all adults age 50+ routine colon cancer screening is highly recommended.     - Recent guidelines from American Cancer Society recommend starting age of 86 - Early detection of colon cancer is important, because often there are no warning signs or symptoms, also if found early usually it can be cured. Late stage is hard to treat.  - If you are not interested in Colonoscopy screening (if done and normal you could be cleared for 5 to 10 years until next due), then Cologuard is an excellent alternative for screening test for Colon Cancer. It is highly sensitive for detecting DNA of colon cancer from even the earliest stages. Also, there is NO bowel prep required. - If Cologuard is NEGATIVE, then it is good for 3 years before next due - If Cologuard is POSITIVE, then it is strongly advised to get a Colonoscopy, which allows the GI doctor to locate the source of the cancer or polyp (even very early stage) and treat it by removing it. ------------------------- If you would like to proceed with Cologuard (stool DNA test) - FIRST, call your insurance company and tell them you want to check cost of Cologuard tell them CPT Code 18471 (it may be completely covered and you could get for no cost, OR max cost without any coverage is about $600). Also, keep in mind if you do NOT open the kit, and decide not to do the test, you will NOT be charged, you should contact the company if you decide not to do the test. - If you want to proceed, you can notify us  (phone message, MyChart Message, or at next visit) and we will order it for you. The test kit will  be delivered to you house within about 1 week. Follow instructions to collect sample, you may call the company for any help or questions, 24/7 telephone support at [phone].    Please schedule a Follow-up Appointment to: Return for 6 month fasting lab > 1 week later Annual Physical.  If you have any other questions or concerns, please feel free to call the office or send a message through MyChart. You may also schedule an earlier appointment if necessary.  Additionally, you may be receiving a survey about your experience at our office within a few days to 1 week by e-mail or mail. We value your feedback.  Marsa Officer, DO [facility], [place]

## 2023-06-07 NOTE — Progress Notes (Signed)
 Subjective:    Patient ID: Colton Long, male    DOB: May 08, 1976, 48 y.o.   MRN: [id number]  Colton Long is a 48 y.o. male presenting on 06/07/2023 for Diabetes   HPI  Discussed the use of AI scribe software for clinical note transcription with the patient, who gave verbal consent to proceed.  History of Present Illness      CHRONIC DM, Type 2 / MORBID OBESITY BMI 37 He admits reduced effectiveness of mounjaro  appetite suppression Reduced exercise lately and diet with Winter A1c has increased in recent months. Prior 7.7 now up to 8.2 today CBG - avg sugar improved Meds: Mounjaro  10mg  weekly Maceo inj (Saturday), Metformin  1000mg  TWICE A DAY Reports good compliance. Tolerating well w/o side-effects Currently on ARB (Losartan ) Lifestyle: Wt up to 299 lbs -Needs to schedule Dr Mevelyn [facility] - Denies hypoglycemia   CHRONIC HTN: Reports doing well without concern Current Meds - Losartan  50mg  daily   Reports good compliance, took meds today. Tolerating well, w/o complaints. Denies CP, dyspnea, HA, edema, dizziness / lightheadedness   Chronic Anxiety and Intermittent Stress - Prior history of anxiety >15-20 years ago, treated with Paxil in past,  - Currently taking Fluoxetine  20mg  daily, doing well needs refill   HYPERLIPIDEMIA: - Reports no concerns. Last lipid panel 10/2022, mostly controlled LDL 100, slight inc due to weight - Currently taking Pravastatin  20mg  daily, tolerating well without side effects or myalgias     Health Maintenance: Recommend Cologuard order, he has kit, he will complete it once confirms coverage      06/07/2023    8:47 AM 11/23/2022    8:19 AM 05/12/2022    8:55 AM  Depression screen PHQ 2/9  Decreased Interest 0 0 0  Down, Depressed, Hopeless 0 0 0  PHQ - 2 Score 0 0 0  Altered sleeping  0 0  Tired, decreased energy  0 0  Change in appetite  0 0  Feeling bad or failure about yourself   0 0  Trouble concentrating  0 0  Moving slowly or  fidgety/restless  0 0  Suicidal thoughts  0 0  PHQ-9 Score  0 0  Difficult doing work/chores  Not difficult at all Not difficult at all       06/07/2023    8:47 AM 11/23/2022    8:19 AM 05/12/2022    8:56 AM 03/11/2022    9:49 AM  GAD 7 : Generalized Anxiety Score  Nervous, Anxious, on Edge 0 0 0 0  Control/stop worrying 0 0 0 0  Worry too much - different things 0 0 0 0  Trouble relaxing 0 0 0 0  Restless 0 0 0 0  Easily annoyed or irritable 0 0 0 0  Afraid - awful might happen 0 0 0 0  Total GAD 7 Score 0 0 0 0  Anxiety Difficulty  Not difficult at all Not difficult at all Not difficult at all    Social History   Tobacco Use   Smoking status: Never   Smokeless tobacco: Never  Vaping Use   Vaping status: Never Used  Substance Use Topics   Alcohol use: Yes    Alcohol/week: 0.0 standard drinks of alcohol    Comment: occasional   Drug use: No    Review of Systems Per HPI unless specifically indicated above     Objective:    BP 110/78   Pulse (!) 101   Ht 6' 3 (1.905 m)  Wt 299 lb (135.6 kg)   SpO2 94%   BMI 37.37 kg/m   Wt Readings from Last 3 Encounters:  06/07/23 299 lb (135.6 kg)  11/23/22 294 lb 12.8 oz (133.7 kg)  05/12/22 288 lb (130.6 kg)    Physical Exam Vitals and nursing note reviewed.  Constitutional:      General: He is not in acute distress.    Appearance: He is well-developed. He is obese. He is not diaphoretic.     Comments: Well-appearing, comfortable, cooperative  HENT:     Head: Normocephalic and atraumatic.  Eyes:     General:        Right eye: No discharge.        Left eye: No discharge.     Conjunctiva/sclera: Conjunctivae normal.  Neck:     Thyroid: No thyromegaly.  Cardiovascular:     Rate and Rhythm: Normal rate and regular rhythm.     Pulses: Normal pulses.     Heart sounds: Normal heart sounds. No murmur heard. Pulmonary:     Effort: Pulmonary effort is normal. No respiratory distress.     Breath sounds: Normal breath  sounds. No wheezing or rales.  Musculoskeletal:        General: Normal range of motion.     Cervical back: Normal range of motion and neck supple.  Lymphadenopathy:     Cervical: No cervical adenopathy.  Skin:    General: Skin is warm and dry.     Findings: No erythema or rash.  Neurological:     Mental Status: He is alert and oriented to person, place, and time. Mental status is at baseline.  Psychiatric:        Behavior: Behavior normal.     Comments: Well groomed, good eye contact, normal speech and thoughts     Recent Labs    11/04/22 0819 06/07/23 0817  HGBA1C 7.7* 8.2*     Results for orders placed or performed in visit on 06/07/23  POCT HgB A1C   Collection Time: 06/07/23  8:17 AM  Result Value Ref Range   Hemoglobin A1C 8.2 (A) 4.0 - 5.6 %   HbA1c POC (<> result, manual entry)     HbA1c, POC (prediabetic range)     HbA1c, POC (controlled diabetic range)        Assessment & Plan:   Problem List Items Addressed This Visit     Anxiety disorder   Relevant Medications   FLUoxetine  (PROZAC ) 20 MG capsule   Essential hypertension   Relevant Medications   losartan  (COZAAR ) 50 MG tablet   Morbid obesity (HCC)   Relevant Medications   MOUNJARO  15 MG/0.5ML Pen   metFORMIN  (GLUCOPHAGE ) 1000 MG tablet   Type 2 diabetes mellitus with other specified complication (HCC) - Primary   Relevant Medications   MOUNJARO  15 MG/0.5ML Pen   losartan  (COZAAR ) 50 MG tablet   metFORMIN  (GLUCOPHAGE ) 1000 MG tablet   Other Relevant Orders   POCT HgB A1C (Completed)      Type 2 Diabetes Mellitus HbA1c increased from 7.7 to 8.2 over the past 6 months. Patient reports no changes in medication adherence. Weight has increased over the past year, and patient reports decreased physical activity. Currently on Mounjaro  10mg  daily. Question if less effective -Increase Mounjaro  to 15mg  daily. 84 day, new rx sent. -Encourage increased physical activity. -Consider use of continuous  glucose monitor to better understand glucose trends and inform lifestyle modifications. Consider Dexcom Stelo OTC or rx but likely uncovered  may cost $99-150 per month -Check HbA1c in 6 months.  Hypertension Well controlled. -Continue current antihypertensive regimen.  Anxiety Stable on Fluoxetine . -Renew Fluoxetine  prescription.  General Health Maintenance -Encourage patient to schedule eye exam for diabetic retinopathy screening. -Encourage patient to complete Cologuard for colorectal cancer screening. -Discussed future consideration for Shingles vaccine.       ____________________________________________________ Additional Rx Information (May be used for Prior Authorization if required)  Medication name and Strength: Mounjaro  15mg   Primary Diagnosis and ICD10 Code: Type 2 Diabetes with complication (E11.69) Secondary Diagnosis and ICD10 Code: n/a  Previous Failed Medications Metformin  1000mg  BID Ozempic  2mg  Quantity and Duration of New Medication: 6 mL for 84 days Additional Supporting Information: Continuation of therapy, dose increase 10 to 15mg  ____________________________________________________     Orders Placed This Encounter  Procedures   POCT HgB A1C    Meds ordered this encounter  Medications   MOUNJARO  15 MG/0.5ML Pen    Sig: Inject 15 mg into the skin once a week.    Dispense:  6 mL    Refill:  1    Dose increase 10 to 15 mg   FLUoxetine  (PROZAC ) 20 MG capsule    Sig: Take 1 capsule (20 mg total) by mouth daily.    Dispense:  90 capsule    Refill:  3    Add refills   losartan  (COZAAR ) 50 MG tablet    Sig: Take 1 tablet (50 mg total) by mouth daily.    Dispense:  90 tablet    Refill:  3   metFORMIN  (GLUCOPHAGE ) 1000 MG tablet    Sig: Take 1 tablet (1,000 mg total) by mouth 2 (two) times daily with a meal.    Dispense:  180 tablet    Refill:  3    Follow up plan: Return for 6 month fasting lab > 1 week later Annual Physical.  Future labs  ordered for 12/06/23  Marsa Officer, DO [facility] 06/07/2023, 8:20 AM

## 2023-09-25 ENCOUNTER — Telehealth: Payer: Self-pay

## 2023-09-25 NOTE — Telephone Encounter (Signed)
 Contacted patient to see if we could send to another pharmacy first before giving out samples. Will check with his wife and let us  know about alternative pharmacy.

## 2023-09-25 NOTE — Telephone Encounter (Signed)
 Copied from CRM [id number]. Topic: Clinical - Medication Question >> Sep 25, 2023 12:23 PM Leory Rands wrote: Reason for CRM: Patient wife is calling to report that pharmacy is out of stock of MOUNJARO  15 MG/0.5ML Pen [[id number]]. Is there any in stock in the office? Please advise

## 2023-10-04 ENCOUNTER — Other Ambulatory Visit: Payer: Self-pay | Admitting: Family Medicine

## 2023-10-04 DIAGNOSIS — E1169 Type 2 diabetes mellitus with other specified complication: Secondary | ICD-10-CM

## 2023-10-06 NOTE — Telephone Encounter (Signed)
 Appointment 12/06/23- will run short - will RF x1 Requested Prescriptions  Pending Prescriptions Disp Refills   pravastatin  (PRAVACHOL ) 20 MG tablet [Pharmacy Med Name: PRAVASTATIN  20MG  TABLETS] 90 tablet 3    Sig: TAKE 1 TABLET(20 MG) BY MOUTH AT BEDTIME     There is no refill protocol information for this order

## 2023-12-06 ENCOUNTER — Other Ambulatory Visit: Payer: Self-pay

## 2023-12-13 ENCOUNTER — Other Ambulatory Visit: Payer: Self-pay | Admitting: Family Medicine

## 2023-12-13 DIAGNOSIS — E1169 Type 2 diabetes mellitus with other specified complication: Secondary | ICD-10-CM

## 2023-12-14 NOTE — Telephone Encounter (Signed)
 Requested medications are due for refill today.  yes  Requested medications are on the active medications list.  yes  Last refill. 06/08/2023 6mL 1 rf  Future visit scheduled.   yes  Notes to clinic.  Refill not delegated.    Requested Prescriptions  Pending Prescriptions Disp Refills   MOUNJARO  15 MG/0.5ML Pen [Pharmacy Med Name: MOUNJARO  15MG /0.5ML INJ ( 4 PENS)] 6 mL 1    Sig: ADMINISTER 15 MG UNDER THE SKIN 1 TIME A WEEK     Off-Protocol Failed - 12/14/2023  4:29 PM      Failed - Medication not assigned to a protocol, review manually.      Failed - Valid encounter within last 12 months    Recent Outpatient Visits   None     Future Appointments             In 5 days Edman, Marsa PARAS, DO [place] [facility], [facility]

## 2023-12-15 ENCOUNTER — Other Ambulatory Visit: Payer: Self-pay

## 2023-12-15 DIAGNOSIS — Z0000 Encounter for general adult medical examination without abnormal findings: Secondary | ICD-10-CM

## 2023-12-15 DIAGNOSIS — E1169 Type 2 diabetes mellitus with other specified complication: Secondary | ICD-10-CM

## 2023-12-15 DIAGNOSIS — I10 Essential (primary) hypertension: Secondary | ICD-10-CM

## 2023-12-15 DIAGNOSIS — Z125 Encounter for screening for malignant neoplasm of prostate: Secondary | ICD-10-CM | POA: Diagnosis not present

## 2023-12-15 DIAGNOSIS — E785 Hyperlipidemia, unspecified: Secondary | ICD-10-CM | POA: Diagnosis not present

## 2023-12-16 LAB — CBC WITH DIFFERENTIAL/PLATELET
Absolute Lymphocytes: 1162 {cells}/uL (ref 850–3900)
Absolute Monocytes: 498 {cells}/uL (ref 200–950)
Basophils Absolute: 42 {cells}/uL (ref 0–200)
Basophils Relative: 0.5 %
Eosinophils Absolute: 108 {cells}/uL (ref 15–500)
Eosinophils Relative: 1.3 %
HCT: 46.5 % (ref 38.5–50.0)
Hemoglobin: 15.3 g/dL (ref 13.2–17.1)
MCH: 29.9 pg (ref 27.0–33.0)
MCHC: 32.9 g/dL (ref 32.0–36.0)
MCV: 90.8 fL (ref 80.0–100.0)
MPV: 10.9 fL (ref 7.5–12.5)
Monocytes Relative: 6 %
Neutro Abs: 6491 {cells}/uL (ref 1500–7800)
Neutrophils Relative %: 78.2 %
Platelets: 319 Thousand/uL (ref 140–400)
RBC: 5.12 Million/uL (ref 4.20–5.80)
RDW: 12.7 % (ref 11.0–15.0)
Total Lymphocyte: 14 %
WBC: 8.3 Thousand/uL (ref 3.8–10.8)

## 2023-12-16 LAB — HEMOGLOBIN A1C
Hgb A1c MFr Bld: 7.8 % — ABNORMAL HIGH (ref <5.7)
Mean Plasma Glucose: 177 mg/dL
eAG (mmol/L): 9.8 mmol/L

## 2023-12-16 LAB — COMPLETE METABOLIC PANEL WITHOUT GFR
AG Ratio: 1.6 (calc) (ref 1.0–2.5)
ALT: 60 U/L — ABNORMAL HIGH (ref 9–46)
AST: 29 U/L (ref 10–35)
Albumin: 4.6 g/dL (ref 3.6–5.1)
Alkaline phosphatase (APISO): 48 U/L (ref 35–144)
BUN: 16 mg/dL (ref 7–25)
CO2: 25 mmol/L (ref 20–32)
Calcium: 9.8 mg/dL (ref 8.6–10.3)
Chloride: 100 mmol/L (ref 98–110)
Creat: 0.76 mg/dL (ref 0.70–1.30)
Globulin: 2.8 g/dL (ref 1.9–3.7)
Glucose, Bld: 200 mg/dL — ABNORMAL HIGH (ref 65–99)
Potassium: 4.4 mmol/L (ref 3.5–5.3)
Sodium: 135 mmol/L (ref 135–146)
Total Bilirubin: 0.7 mg/dL (ref 0.2–1.2)
Total Protein: 7.4 g/dL (ref 6.1–8.1)

## 2023-12-16 LAB — MICROALBUMIN / CREATININE URINE RATIO
Creatinine, Urine: 380 mg/dL — ABNORMAL HIGH (ref 20–320)
Microalb Creat Ratio: 52 mg/g{creat} — ABNORMAL HIGH (ref <30)
Microalb, Ur: 19.6 mg/dL

## 2023-12-16 LAB — LIPID PANEL
Cholesterol: 166 mg/dL (ref <200)
HDL: 41 mg/dL (ref >=40)
LDL Cholesterol (Calc): 100 mg/dL — ABNORMAL HIGH
Non-HDL Cholesterol (Calc): 125 mg/dL (ref <130)
Total CHOL/HDL Ratio: 4 (calc) (ref <5.0)
Triglycerides: 149 mg/dL (ref <150)

## 2023-12-16 LAB — TSH: TSH: 2.26 m[IU]/L (ref 0.40–4.50)

## 2023-12-16 LAB — PSA: PSA: 1.1 ng/mL (ref <=4.00)

## 2023-12-18 ENCOUNTER — Other Ambulatory Visit (HOSPITAL_COMMUNITY): Payer: Self-pay

## 2023-12-18 ENCOUNTER — Telehealth: Payer: Self-pay

## 2023-12-18 NOTE — Telephone Encounter (Signed)
 Pharmacy Patient Advocate Encounter   Received notification from Onbase that prior authorization for Mounjaro  15MG /0.5ML auto-injectors  is required/requested.   Insurance verification completed.   The patient is insured through [facility] .   Per test claim: PA required; PA submitted to above mentioned insurance via CoverMyMeds Key/confirmation #/EOC [id number] Status is pending

## 2023-12-19 ENCOUNTER — Ambulatory Visit (INDEPENDENT_AMBULATORY_CARE_PROVIDER_SITE_OTHER): Payer: Self-pay | Admitting: Family Medicine

## 2023-12-19 ENCOUNTER — Encounter: Payer: Self-pay | Admitting: Family Medicine

## 2023-12-19 ENCOUNTER — Other Ambulatory Visit: Payer: Self-pay | Admitting: Family Medicine

## 2023-12-19 ENCOUNTER — Other Ambulatory Visit (HOSPITAL_COMMUNITY): Payer: Self-pay

## 2023-12-19 VITALS — BP 110/70 | HR 92 | Ht 75.0 in | Wt 289.6 lb

## 2023-12-19 DIAGNOSIS — E1169 Type 2 diabetes mellitus with other specified complication: Secondary | ICD-10-CM

## 2023-12-19 DIAGNOSIS — I10 Essential (primary) hypertension: Secondary | ICD-10-CM | POA: Diagnosis not present

## 2023-12-19 DIAGNOSIS — E785 Hyperlipidemia, unspecified: Secondary | ICD-10-CM

## 2023-12-19 DIAGNOSIS — Z0000 Encounter for general adult medical examination without abnormal findings: Secondary | ICD-10-CM | POA: Diagnosis not present

## 2023-12-19 DIAGNOSIS — Z7985 Long-term (current) use of injectable non-insulin antidiabetic drugs: Secondary | ICD-10-CM

## 2023-12-19 DIAGNOSIS — Z8249 Family history of ischemic heart disease and other diseases of the circulatory system: Secondary | ICD-10-CM

## 2023-12-19 MED ORDER — PRAVASTATIN SODIUM 20 MG PO TABS: 20.0000 mg | ORAL_TABLET | Freq: Every day | ORAL | 3 refills | Status: AC

## 2023-12-19 NOTE — Patient Instructions (Addendum)
 Thank you for coming to the office today.  [facility] Exam, schedule and send us  report.  Pharmacy FYI Prevnar-20 Pneumonia vaccine when ready at pharmacy 50+, check into cost coverage.  Colon Cancer Screening:  24/7 telephone support at [phone].  Colon Cancer Screening: - For all adults age 50+ routine colon cancer screening is highly recommended.     - Recent guidelines from American Cancer Society recommend starting age of 61 - Early detection of colon cancer is important, because often there are no warning signs or symptoms, also if found early usually it can be cured. Late stage is hard to treat.   - If Cologuard is NEGATIVE, then it is good for 3 years before next due - If Cologuard is POSITIVE, then it is strongly advised to get a Colonoscopy, which allows the GI doctor to locate the source of the cancer or polyp (even very early stage) and treat it by removing it. ------------------------- Follow instructions to collect sample, you may call the company for any help or questions, 24/7 telephone support at [phone].   You have been referred for a Coronary Calcium Score Cardiac CT Scan. This is a screening test for patients aged 23-50+ with cardiovascular risk factors or who are healthy but would be interested in Cardiovascular Screening for heart disease. Even if there is a family history of heart disease, this imaging can be useful. Typically it can be done every 5+ years or at a different timeline we agree on  The scan will look at the chest and mainly focus on the heart and identify early signs of calcium build up or blockages within the heart arteries. It is not 100% accurate for identifying blockages or heart disease, but it is useful to help us  predict who may have some early changes or be at risk in the future for a heart attack or cardiovascular problem.  The results are reviewed by a Cardiologist and they will document the results. It should become available  on MyChart. Typically the results are divided into percentiles based on other patients of the same demographic and age. So it will compare your risk to others similar to you. If you have a higher score >99 or higher percentile >75%tile, it is recommended to consider Statin cholesterol therapy and or referral to Cardiologist. I will try to help explain your results and if we have questions we can contact the Cardiologist.  You will be contacted for scheduling. Usually it is done at any imaging facility through [facility], [facility] or [facility].  The cost is $99 flat fee total and it does not go through insurance, so no authorization is required.    Please schedule a Follow-up Appointment to: Return in about 5 months (around 05/20/2024) for 5 month fasting lab then 1 week later Follow-up DM, Lab results.  If you have any other questions or concerns, please feel free to call the office or send a message through MyChart. You may also schedule an earlier appointment if necessary.  Additionally, you may be receiving a survey about your experience at our office within a few days to 1 week by e-mail or mail. We value your feedback.  Marsa Officer, DO [facility], [place]

## 2023-12-19 NOTE — Telephone Encounter (Signed)
 Pharmacy Patient Advocate Encounter  Received notification from [facility] that Prior Authorization for Mounjaro  15MG /0.5ML auto-injectors  has been APPROVED from 12/18/23 to 12/17/24. Ran test claim, Copay is $25. This test claim was processed through [facility]- copay amounts may vary at other pharmacies due to pharmacy/plan contracts, or as the patient moves through the different stages of their insurance plan.   PA #/Case ID/Reference #: [id number]

## 2023-12-19 NOTE — Progress Notes (Signed)
 Subjective:    Patient ID: Colton Long, male    DOB: 01/10/1976, 48 y.o.   MRN: [id number]  Colton Long is a 48 y.o. male presenting on 12/19/2023 for Annual Exam   HPI  Discussed the use of AI scribe software for clinical note transcription with the patient, who gave verbal consent to proceed.  History of Present Illness   Colton Long is a 48 year old male who presents for an annual physical exam.   CHRONIC DM, Type 2 / MORBID OBESITY BMI 36 Improved on Mounjaro  current dose. Missed 1 week Mounjaro  Better appetite curb now on medicine. Reduced portions Awaiting new insurance [employer] prior approval now, it was submitted on 7/21 pending CBG - avg sugar improved Meds: Mounjaro  15mg  weekly [place] inj (Saturday), Metformin  1000mg  TWICE A DAY Reports good compliance. Tolerating well w/o side-effects Currently on ARB (Losartan ) Lifestyle: Weight down 10 lbs in past 6 months -Needs to schedule Dr [facility], due - Denies hypoglycemia   CHRONIC HTN: Reports doing well without concern Current Meds - Losartan  50mg  daily   Reports good compliance, took meds today. Tolerating well, w/o complaints. Denies CP, dyspnea, HA, edema, dizziness / lightheadedness   Chronic Anxiety and Intermittent Stress - Prior history of anxiety >15-20 years ago, treated with Paxil in past,  - Currently taking Fluoxetine  20mg  daily, doing well needs refill   HYPERLIPIDEMIA: - Reports no concerns. Last lipid panel 11/2023, mostly controlled LDL 100 - Currently taking Pravastatin  20mg  daily, tolerating well without side effects or myalgias     Health Maintenance: Recommend Cologuard order, he has kit, he will complete it once confirms coverage with new BCBS  Recommend Prevnar-20 vaccine age 51+  Considering Shingrix age 71+  PSA from 0.50 up to 1.10 in past 1 year. Asymptomatic. No new symptoms.      12/19/2023    8:12 AM 06/07/2023    8:47 AM 11/23/2022    8:19 AM  Depression screen PHQ  2/9  Decreased Interest 0 0 0  Down, Depressed, Hopeless 0 0 0  PHQ - 2 Score 0 0 0  Altered sleeping   0  Tired, decreased energy   0  Change in appetite   0  Feeling bad or failure about yourself    0  Trouble concentrating   0  Moving slowly or fidgety/restless   0  Suicidal thoughts   0  PHQ-9 Score   0  Difficult doing work/chores   Not difficult at all       12/19/2023    8:12 AM 06/07/2023    8:47 AM 11/23/2022    8:19 AM 05/12/2022    8:56 AM  GAD 7 : Generalized Anxiety Score  Nervous, Anxious, on Edge 0 0 0 0  Control/stop worrying 0 0 0 0  Worry too much - different things 0 0 0 0  Trouble relaxing 0 0 0 0  Restless 0 0 0 0  Easily annoyed or irritable 0 0 0 0  Afraid - awful might happen 0 0 0 0  Total GAD 7 Score 0 0 0 0  Anxiety Difficulty   Not difficult at all Not difficult at all     Past Medical History:  Diagnosis Date   Hypertension    Sleep apnea    Past Surgical History:  Procedure Laterality Date   CYSTECTOMY  2006,2011   head    NASAL SEPTUM SURGERY  2005   skin lesion exicision  2012   scalp Dr[facility]   Social History   Socioeconomic History   Marital status: Married    Spouse name: Not on file   Number of children: Not on file   Years of education: Not on file   Highest education level: Not on file  Occupational History   Occupation: Emergency planning/management officer  Tobacco Use   Smoking status: Never   Smokeless tobacco: Never  Vaping Use   Vaping status: Never Used  Substance and Sexual Activity   Alcohol use: Yes    Alcohol/week: 0.0 standard drinks of alcohol    Comment: occasional   Drug use: No   Sexual activity: Yes  Other Topics Concern   Not on file  Social History Narrative   Not on file   Social Drivers of Health   Financial Resource Strain: Not on file  Food Insecurity: Not on file  Transportation Needs: Not on file  Physical Activity: Not on file  Stress: Not on file  Social Connections: Not on file  Intimate  Partner Violence: Not on file   Family History  Problem Relation Age of Onset   Hypertension Mother    Diabetes Mother    Cancer Father        lung   Heart disease Father    Hypertension Father    Diabetes Father    Prostate cancer Paternal Uncle    Colon cancer Neg Hx    Current Outpatient Medications on File Prior to Visit  Medication Sig   Blood Glucose Monitoring Suppl (ONE TOUCH ULTRA SYSTEM KIT) w/Device KIT 1 kit by Does not apply route once.   FLUoxetine  (PROZAC ) 20 MG capsule Take 1 capsule (20 mg total) by mouth daily.   glucose blood test strip 1 each by Other route as needed for other. Use as instructed   Insulin  Pen Needle (INSUPEN PEN NEEDLES) 32G X 4 MM MISC Use with Ozempic  pen inject weekly   losartan  (COZAAR ) 50 MG tablet Take 1 tablet (50 mg total) by mouth daily.   metFORMIN  (GLUCOPHAGE ) 1000 MG tablet Take 1 tablet (1,000 mg total) by mouth 2 (two) times daily with a meal.   MOUNJARO  15 MG/0.5ML Pen ADMINISTER 15 MG UNDER THE SKIN 1 TIME A WEEK   mupirocin  ointment (BACTROBAN ) 2 % Apply 1 Application topically 2 (two) times daily. For 1-2 weeks as needed for skin sores.   Cholecalciferol (VITAMIN D -3 PO) Take 2,000 Int'l Units by mouth daily. (Patient not taking: Reported on 12/19/2023)   fluticasone  (FLONASE ) 50 MCG/ACT nasal spray Place 2 sprays into both nostrils daily. Use for 4-6 weeks then stop and use seasonally or as needed. (Patient not taking: Reported on 12/19/2023)   No current facility-administered medications on file prior to visit.    Review of Systems  Constitutional:  Negative for activity change, appetite change, chills, diaphoresis, fatigue and fever.  HENT:  Negative for congestion and hearing loss.   Eyes:  Negative for visual disturbance.  Respiratory:  Negative for cough, chest tightness, shortness of breath and wheezing.   Cardiovascular:  Negative for chest pain, palpitations and leg swelling.  Gastrointestinal:  Negative for abdominal  pain, constipation, diarrhea, nausea and vomiting.  Genitourinary:  Negative for dysuria, frequency and hematuria.  Musculoskeletal:  Negative for arthralgias and neck pain.  Skin:  Negative for rash.  Neurological:  Negative for dizziness, weakness, light-headedness, numbness and headaches.  Hematological:  Negative for adenopathy.  Psychiatric/Behavioral:  Negative for behavioral problems, dysphoric mood and sleep  disturbance.    Per HPI unless specifically indicated above     Objective:    BP 110/70 (BP Location: Left Arm, Patient Position: Sitting, Cuff Size: Large)   Pulse 92   Ht 6' 3 (1.905 m)   Wt 289 lb 9 oz (131.3 kg)   SpO2 93%   BMI 36.19 kg/m   Wt Readings from Last 3 Encounters:  12/19/23 289 lb 9 oz (131.3 kg)  06/07/23 299 lb (135.6 kg)  11/23/22 294 lb 12.8 oz (133.7 kg)    Physical Exam Vitals and nursing note reviewed.  Constitutional:      General: He is not in acute distress.    Appearance: He is well-developed. He is obese. He is not diaphoretic.     Comments: Well-appearing, comfortable, cooperative  HENT:     Head: Normocephalic and atraumatic.  Eyes:     General:        Right eye: No discharge.        Left eye: No discharge.     Conjunctiva/sclera: Conjunctivae normal.     Pupils: Pupils are equal, round, and reactive to light.  Neck:     Thyroid: No thyromegaly.     Vascular: No carotid bruit.  Cardiovascular:     Rate and Rhythm: Normal rate and regular rhythm.     Pulses: Normal pulses.     Heart sounds: Normal heart sounds. No murmur heard. Pulmonary:     Effort: Pulmonary effort is normal. No respiratory distress.     Breath sounds: Normal breath sounds. No wheezing or rales.  Abdominal:     General: Bowel sounds are normal. There is no distension.     Palpations: Abdomen is soft. There is no mass.     Tenderness: There is no abdominal tenderness.  Musculoskeletal:        General: No tenderness. Normal range of motion.     Cervical  back: Normal range of motion and neck supple.     Right lower leg: No edema.     Left lower leg: No edema.     Comments: Upper / Lower Extremities: - Normal muscle tone, strength bilateral upper extremities 5/5, lower extremities 5/5  Lymphadenopathy:     Cervical: No cervical adenopathy.  Skin:    General: Skin is warm and dry.     Findings: No erythema or rash.  Neurological:     Mental Status: He is alert and oriented to person, place, and time.     Comments: Distal sensation intact to light touch all extremities  Psychiatric:        Mood and Affect: Mood normal.        Behavior: Behavior normal.        Thought Content: Thought content normal.     Comments: Well groomed, good eye contact, normal speech and thoughts      Diabetic Foot Exam - Simple   Simple Foot Form Diabetic Foot exam was performed with the following findings: Yes 12/19/2023  8:18 AM  Visual Inspection See comments: Yes Sensation Testing Intact to touch and monofilament testing bilaterally: Yes Pulse Check Posterior Tibialis and Dorsalis pulse intact bilaterally: Yes Comments Mild callus formation heels and forefoot. No ulceration. Sensation intact.      Results for orders placed or performed in visit on 12/15/23  TSH   Collection Time: 12/15/23 10:19 AM  Result Value Ref Range   TSH 2.26 0.40 - 4.50 mIU/L  Microalbumin / creatinine urine ratio   Collection Time:  12/15/23 10:19 AM  Result Value Ref Range   Creatinine, Urine 380 (H) 20 - 320 mg/dL   Microalb, Ur 80.3 mg/dL   Microalb Creat Ratio 52 (H) <30 mg/g creat  PSA   Collection Time: 12/15/23 10:19 AM  Result Value Ref Range   PSA 1.10 < OR = 4.00 ng/mL  COMPLETE METABOLIC PANEL WITH GFR   Collection Time: 12/15/23 10:19 AM  Result Value Ref Range   Glucose, Bld 200 (H) 65 - 99 mg/dL   BUN 16 7 - 25 mg/dL   Creat 9.23 9.29 - 8.69 mg/dL   BUN/Creatinine Ratio SEE NOTE: 6 - 22 (calc)   Sodium 135 135 - 146 mmol/L   Potassium 4.4 3.5 -  5.3 mmol/L   Chloride 100 98 - 110 mmol/L   CO2 25 20 - 32 mmol/L   Calcium 9.8 8.6 - 10.3 mg/dL   Total Protein 7.4 6.1 - 8.1 g/dL   Albumin 4.6 3.6 - 5.1 g/dL   Globulin 2.8 1.9 - 3.7 g/dL (calc)   AG Ratio 1.6 1.0 - 2.5 (calc)   Total Bilirubin 0.7 0.2 - 1.2 mg/dL   Alkaline phosphatase (APISO) 48 35 - 144 U/L   AST 29 10 - 35 U/L   ALT 60 (H) 9 - 46 U/L  CBC with Differential/Platelet   Collection Time: 12/15/23 10:19 AM  Result Value Ref Range   WBC 8.3 3.8 - 10.8 Thousand/uL   RBC 5.12 4.20 - 5.80 Million/uL   Hemoglobin 15.3 13.2 - 17.1 g/dL   HCT 53.4 61.4 - 49.9 %   MCV 90.8 80.0 - 100.0 fL   MCH 29.9 27.0 - 33.0 pg   MCHC 32.9 32.0 - 36.0 g/dL   RDW 87.2 88.9 - 84.9 %   Platelets 319 140 - 400 Thousand/uL   MPV 10.9 7.5 - 12.5 fL   Neutro Abs 6,491 1,500 - 7,800 cells/uL   Absolute Lymphocytes 1,162 850 - 3,900 cells/uL   Absolute Monocytes 498 200 - 950 cells/uL   Eosinophils Absolute 108 15 - 500 cells/uL   Basophils Absolute 42 0 - 200 cells/uL   Neutrophils Relative % 78.2 %   Total Lymphocyte 14.0 %   Monocytes Relative 6.0 %   Eosinophils Relative 1.3 %   Basophils Relative 0.5 %  Hemoglobin A1c   Collection Time: 12/15/23 10:19 AM  Result Value Ref Range   Hgb A1c MFr Bld 7.8 (H) <5.7 %   Mean Plasma Glucose 177 mg/dL   eAG (mmol/L) 9.8 mmol/L  Lipid panel   Collection Time: 12/15/23 10:19 AM  Result Value Ref Range   Cholesterol 166 <200 mg/dL   HDL 41 > OR = 40 mg/dL   Triglycerides 850 <849 mg/dL   LDL Cholesterol (Calc) 100 (H) mg/dL (calc)   Total CHOL/HDL Ratio 4.0 <5.0 (calc)   Non-HDL Cholesterol (Calc) 125 <130 mg/dL (calc)      Assessment & Plan:   Problem List Items Addressed This Visit     Essential hypertension   Relevant Medications   pravastatin  (PRAVACHOL ) 20 MG tablet   Other Relevant Orders   CT CARDIAC SCORING (SELF PAY ONLY)   Hyperlipidemia associated with type 2 diabetes mellitus (HCC)   Relevant Medications    pravastatin  (PRAVACHOL ) 20 MG tablet   Other Relevant Orders   CT CARDIAC SCORING (SELF PAY ONLY)   Morbid obesity (HCC)   Relevant Orders   CT CARDIAC SCORING (SELF PAY ONLY)   Type 2 diabetes mellitus with  other specified complication (HCC)   Relevant Medications   pravastatin  (PRAVACHOL ) 20 MG tablet   Other Relevant Orders   CT CARDIAC SCORING (SELF PAY ONLY)   Other Visit Diagnoses       Annual physical exam    -  Primary     Family history of heart disease       Relevant Orders   CT CARDIAC SCORING (SELF PAY ONLY)     Long-term current use of injectable noninsulin antidiabetic medication            Updated Health Maintenance information Reviewed recent lab results with patient Encouraged improvement to lifestyle with diet and exercise Goal of weight loss   Type 2 Diabetes Mellitus Improved glycemic control with A1c reduced to 7.8%. Mounjaro  and Metformin  regimen effective. Temporary delay in Mounjaro  due to insurance, not a major concern unless chronic. - Continue Mounjaro  15 mg and Metformin  1000 mg twice daily. - Monitor A1c and labs in December.  Proteinuria Mild proteinuria likely secondary to diabetes. Normal kidney function. - Monitor proteinuria and kidney function in December.  Hyperlipidemia LDL at 100 mg/dL. Managed with Pravastatin . - Continue Pravastatin . - Monitor cholesterol levels in December.  Elevated Liver Enzymes Mild ALT elevation to 60 U/L, likely related to cholesterol or weight. - Recheck liver enzymes in December.  Hypertension Well-controlled with Losartan  50 mg. BP at 110/70 mmHg. - Continue Losartan  50 mg.  General Health Maintenance Due for Prevnar 20 and Shingrix vaccines. Eye exam and colon cancer screening pending. Coronary artery calcium score recommended due to family history of heart disease. - Check insurance for Prevnar 20 and Shingrix vaccines and obtain at pharmacy. - Schedule eye exam. - Update insurance for Cologuard  and complete colon cancer screening. - Order coronary artery calcium score.  Follow-up Routine follow-up to monitor chronic conditions and reassess labs. - Schedule follow-up in December. - Perform blood draw before December appointment to recheck chemistry, A1c, and cholesterol.         Orders Placed This Encounter  Procedures   CT CARDIAC SCORING (SELF PAY ONLY)    Standing Status:   Future    Expiration Date:   12/18/2024    Preferred imaging location?:   [facility]    Meds ordered this encounter  Medications   pravastatin  (PRAVACHOL ) 20 MG tablet    Sig: Take 1 tablet (20 mg total) by mouth daily.    Dispense:  90 tablet    Refill:  3    Add refills for 1 year    Follow up plan: Return in about 5 months (around 05/20/2024) for 5 month fasting lab then 1 week later Follow-up DM, Lab results.  Future labs 04/2024 CMET, A1c, Lipid  Marsa Officer, DO [facility] 12/19/2023, 8:13 AM

## 2024-04-01 ENCOUNTER — Other Ambulatory Visit: Payer: Self-pay | Admitting: Family Medicine

## 2024-04-01 DIAGNOSIS — F419 Anxiety disorder, unspecified: Secondary | ICD-10-CM

## 2024-04-02 NOTE — Telephone Encounter (Signed)
 Too soon for refill, LRF 06/24/23 for 90 and 3 RF.  Requested Prescriptions  Pending Prescriptions Disp Refills   FLUoxetine  (PROZAC ) 20 MG capsule [Pharmacy Med Name: FLUOXETINE  20MG  CAPSULES] 90 capsule 3    Sig: TAKE 1 CAPSULE(20 MG) BY MOUTH DAILY     Psychiatry:  Antidepressants - SSRI Passed - 04/02/2024  2:55 PM      Passed - Valid encounter within last 6 months    Recent Outpatient Visits           3 months ago Annual physical exam   [facility] [place], Marsa PARAS, [place]

## 2024-04-05 NOTE — Telephone Encounter (Unsigned)
 Copied from CRM #[id number]. Topic: Clinical - Medication Refill >> Apr 05, 2024  8:02 AM Harlene ORN wrote: Medication: losartan  (COZAAR ) 50 MG tablet  Has the patient contacted their pharmacy? Yes (Agent: If no, request that the patient contact the pharmacy for the refill. If patient does not wish to contact the pharmacy document the reason why and proceed with request.) (Agent: If yes, when and what did the pharmacy advise?)  This is the patient's preferred pharmacy:  [id number] [place], [place] - [address] AT [facility] [address] [place] [address] [place] [address] Phone: [phone] Fax: [phone]  Is this the correct pharmacy for this prescription? Yes If no, delete pharmacy and type the correct one.   Has the prescription been filled recently? No  Is the patient out of the medication? Yes  Has the patient been seen for an appointment in the last year OR does the patient have an upcoming appointment? Yes  Can we respond through MyChart? Yes  Agent: Please be advised that Rx refills may take up to 3 business days. We ask that you follow-up with your pharmacy.

## 2024-05-17 ENCOUNTER — Other Ambulatory Visit

## 2024-05-17 DIAGNOSIS — E785 Hyperlipidemia, unspecified: Secondary | ICD-10-CM | POA: Diagnosis not present

## 2024-05-17 DIAGNOSIS — E1169 Type 2 diabetes mellitus with other specified complication: Secondary | ICD-10-CM

## 2024-05-18 LAB — COMPREHENSIVE METABOLIC PANEL WITH GFR
AG Ratio: 1.7 (calc) (ref 1.0–2.5)
ALT: 44 U/L (ref 9–46)
AST: 22 U/L (ref 10–35)
Albumin: 4.3 g/dL (ref 3.6–5.1)
Alkaline phosphatase (APISO): 57 U/L (ref 35–144)
BUN: 13 mg/dL (ref 7–25)
CO2: 26 mmol/L (ref 20–32)
Calcium: 9.3 mg/dL (ref 8.6–10.3)
Chloride: 103 mmol/L (ref 98–110)
Creat: 0.8 mg/dL (ref 0.70–1.30)
Globulin: 2.5 g/dL (ref 1.9–3.7)
Glucose, Bld: 179 mg/dL — ABNORMAL HIGH (ref 65–99)
Potassium: 4.8 mmol/L (ref 3.5–5.3)
Sodium: 139 mmol/L (ref 135–146)
Total Bilirubin: 0.5 mg/dL (ref 0.2–1.2)
Total Protein: 6.8 g/dL (ref 6.1–8.1)
eGFR: 108 mL/min/1.73m2

## 2024-05-18 LAB — LIPID PANEL
Cholesterol: 159 mg/dL
HDL: 47 mg/dL
LDL Cholesterol (Calc): 87 mg/dL
Non-HDL Cholesterol (Calc): 112 mg/dL
Total CHOL/HDL Ratio: 3.4 (calc)
Triglycerides: 151 mg/dL — ABNORMAL HIGH

## 2024-05-18 LAB — HEMOGLOBIN A1C
Hgb A1c MFr Bld: 7.6 % — ABNORMAL HIGH
Mean Plasma Glucose: 171 mg/dL
eAG (mmol/L): 9.5 mmol/L

## 2024-05-21 ENCOUNTER — Encounter: Payer: Self-pay | Admitting: Family Medicine

## 2024-05-21 ENCOUNTER — Other Ambulatory Visit: Payer: Self-pay | Admitting: Family Medicine

## 2024-05-21 ENCOUNTER — Ambulatory Visit: Admitting: Family Medicine

## 2024-05-21 VITALS — BP 124/68 | HR 90 | Ht 75.0 in | Wt 292.0 lb

## 2024-05-21 DIAGNOSIS — F419 Anxiety disorder, unspecified: Secondary | ICD-10-CM

## 2024-05-21 DIAGNOSIS — D234 Other benign neoplasm of skin of scalp and neck: Secondary | ICD-10-CM

## 2024-05-21 DIAGNOSIS — Z1211 Encounter for screening for malignant neoplasm of colon: Secondary | ICD-10-CM

## 2024-05-21 DIAGNOSIS — Z7985 Long-term (current) use of injectable non-insulin antidiabetic drugs: Secondary | ICD-10-CM

## 2024-05-21 DIAGNOSIS — I10 Essential (primary) hypertension: Secondary | ICD-10-CM | POA: Diagnosis not present

## 2024-05-21 DIAGNOSIS — E1169 Type 2 diabetes mellitus with other specified complication: Secondary | ICD-10-CM | POA: Diagnosis not present

## 2024-05-21 DIAGNOSIS — Z8249 Family history of ischemic heart disease and other diseases of the circulatory system: Secondary | ICD-10-CM

## 2024-05-21 DIAGNOSIS — Z125 Encounter for screening for malignant neoplasm of prostate: Secondary | ICD-10-CM

## 2024-05-21 DIAGNOSIS — Z0000 Encounter for general adult medical examination without abnormal findings: Secondary | ICD-10-CM

## 2024-05-21 DIAGNOSIS — E785 Hyperlipidemia, unspecified: Secondary | ICD-10-CM | POA: Diagnosis not present

## 2024-05-21 MED ORDER — FLUOXETINE HCL 20 MG PO CAPS: 20.0000 mg | ORAL_CAPSULE | Freq: Every day | ORAL | 3 refills | Status: AC

## 2024-05-21 MED ORDER — LOSARTAN POTASSIUM 50 MG PO TABS: 50.0000 mg | ORAL_TABLET | Freq: Every day | ORAL | 3 refills | Status: AC

## 2024-05-21 MED ORDER — METFORMIN HCL 1000 MG PO TABS: 1000.0000 mg | ORAL_TABLET | Freq: Two times a day (BID) | ORAL | 3 refills | Status: AC

## 2024-05-21 MED ORDER — MOUNJARO 15 MG/0.5ML ~~LOC~~ SOAJ: 15.0000 mg | SUBCUTANEOUS | 5 refills | Status: AC

## 2024-05-21 NOTE — Progress Notes (Signed)
 "  Subjective:    Patient ID: Colton Long, male    DOB: [birth year], 48 y.o.   MRN: [id number]  Colton Long is a 48 y.o. male presenting on 05/21/2024 for Medical Management of Chronic Issues   HPI  Discussed the use of AI scribe software for clinical note transcription with the patient, who gave verbal consent to proceed.  History of Present Illness   Colton Long is a 48 year old male who presents for a routine follow-up and management of his chronic conditions.  Scalp cyst - Increasing in size and tenderness - Approximately 2 cm in diameter, located on the right side of the scalp - History of similar cysts excised in the past - Increased sensitivity with growth, especially when touched or during haircuts  Preventive health maintenance - Received influenza and pneumococcal vaccines on March 09, 2024, at [facility] - Plans to schedule shingles vaccine - Needs to reorder Cologuard test as previous kit is outdated; test is covered by insurance      CHRONIC DM, Type 2 / MORBID OBESITY BMI 36 Doing well on current therapy. - Recent hemoglobin A1c is 7.6; previous A1c was 8.25 - No adverse effects from diabetes medications - Emphasizes importance of glycemic control, especially during periods of decreased physical activity in colder weather CBG - avg sugar improved Meds: Mounjaro  15mg  weekly Colton Long inj (Saturday), Metformin  1000mg  TWICE A DAY Reports good compliance. Tolerating well w/o side-effects Currently on ARB (Losartan ) Lifestyle: -Needs to schedule Dr [facility], due - Denies hypoglycemia   CHRONIC HTN: Reports doing well without concern Current Meds - Losartan  50mg  daily   Reports good compliance, took meds today. Tolerating well, w/o complaints. Denies CP, dyspnea, HA, edema, dizziness / lightheadedness   Chronic Anxiety and Intermittent Stress - Prior history of anxiety >15-20 years ago, treated with Paxil in past,  - Currently taking Fluoxetine  20mg   daily, doing well needs refill   HYPERLIPIDEMIA: - Reports no concerns. Last lipid panel 11/2023, mostly controlled LDL 100 - Currently taking Pravastatin  20mg  daily, tolerating well without side effects or myalgias     Health Maintenance: Ordered Cologuard for first colorectal cancer screening age 47+    Considering Shingrix age 41+ to schedule at pharmacy  Updated Flu and Pneumonia vaccine 03/09/24 at pharmacy Completed Prevnar-20 vaccine age 65+     05/21/2024    9:56 AM 12/19/2023    8:12 AM 06/07/2023    8:47 AM  Depression screen PHQ 2/9  Decreased Interest 0 0 0  Down, Depressed, Hopeless 0 0 0  PHQ - 2 Score 0 0 0       05/21/2024    9:56 AM 12/19/2023    8:12 AM 06/07/2023    8:47 AM 11/23/2022    8:19 AM  GAD 7 : Generalized Anxiety Score  Nervous, Anxious, on Edge 0 0 0 0  Control/stop worrying 0 0 0 0  Worry too much - different things 0 0 0 0  Trouble relaxing 0 0 0 0  Restless 0 0 0 0  Easily annoyed or irritable 0 0 0 0  Afraid - awful might happen 0 0 0 0  Total GAD 7 Score 0 0 0 0  Anxiety Difficulty    Not difficult at all    Social History[1]  Review of Systems Per HPI unless specifically indicated above     Objective:    BP 124/68 (BP Location: Left Arm, Patient Position: Sitting, Cuff Size: Large)  Pulse 90   Ht 6' 3 (1.905 m)   Wt 292 lb (132.5 kg)   SpO2 96%   BMI 36.50 kg/m   Wt Readings from Last 3 Encounters:  05/21/24 292 lb (132.5 kg)  12/19/23 289 lb 9 oz (131.3 kg)  06/07/23 299 lb (135.6 kg)    Physical Exam Vitals and nursing note reviewed.  Constitutional:      General: He is not in acute distress.    Appearance: He is well-developed. He is not diaphoretic.     Comments: Well-appearing, comfortable, cooperative  HENT:     Head: Normocephalic and atraumatic.  Eyes:     General:        Right eye: No discharge.        Left eye: No discharge.     Conjunctiva/sclera: Conjunctivae normal.  Neck:     Thyroid: No  thyromegaly.  Cardiovascular:     Rate and Rhythm: Normal rate and regular rhythm.     Pulses: Normal pulses.     Heart sounds: Normal heart sounds. No murmur heard. Pulmonary:     Effort: Pulmonary effort is normal. No respiratory distress.     Breath sounds: Normal breath sounds. No wheezing or rales.  Musculoskeletal:        General: Normal range of motion.     Cervical back: Normal range of motion and neck supple.  Lymphadenopathy:     Cervical: No cervical adenopathy.  Skin:    General: Skin is warm and dry.     Findings: No erythema or rash.  Neurological:     Mental Status: He is alert and oriented to person, place, and time. Mental status is at baseline.  Psychiatric:        Behavior: Behavior normal.     Comments: Well groomed, good eye contact, normal speech and thoughts     Results for orders placed or performed in visit on 05/17/24  Comprehensive metabolic panel with GFR   Collection Time: 05/17/24  7:55 AM  Result Value Ref Range   Glucose, Bld 179 (H) 65 - 99 mg/dL   BUN 13 7 - 25 mg/dL   Creat 9.19 9.29 - 8.69 mg/dL   eGFR 891 > OR = 60 fO/fpw/8.26f7   BUN/Creatinine Ratio SEE NOTE: 6 - 22 (calc)   Sodium 139 135 - 146 mmol/L   Potassium 4.8 3.5 - 5.3 mmol/L   Chloride 103 98 - 110 mmol/L   CO2 26 20 - 32 mmol/L   Calcium 9.3 8.6 - 10.3 mg/dL   Total Protein 6.8 6.1 - 8.1 g/dL   Albumin 4.3 3.6 - 5.1 g/dL   Globulin 2.5 1.9 - 3.7 g/dL (calc)   AG Ratio 1.7 1.0 - 2.5 (calc)   Total Bilirubin 0.5 0.2 - 1.2 mg/dL   Alkaline phosphatase (APISO) 57 35 - 144 U/L   AST 22 10 - 35 U/L   ALT 44 9 - 46 U/L  Lipid panel   Collection Time: 05/17/24  7:55 AM  Result Value Ref Range   Cholesterol 159 <200 mg/dL   HDL 47 > OR = 40 mg/dL   Triglycerides 848 (H) <150 mg/dL   LDL Cholesterol (Calc) 87 mg/dL (calc)   Total CHOL/HDL Ratio 3.4 <5.0 (calc)   Non-HDL Cholesterol (Calc) 112 <130 mg/dL (calc)  Hemoglobin J8r   Collection Time: 05/17/24  7:55 AM  Result  Value Ref Range   Hgb A1c MFr Bld 7.6 (H) <5.7 %   Mean Plasma Glucose  171 mg/dL   eAG (mmol/L) 9.5 mmol/L      Assessment & Plan:   Problem List Items Addressed This Visit     Anxiety disorder   Relevant Medications   FLUoxetine  (PROZAC ) 20 MG capsule   Essential hypertension   Relevant Medications   losartan  (COZAAR ) 50 MG tablet   Other Relevant Orders   CT CARDIAC SCORING (SELF PAY ONLY)   Hyperlipidemia associated with type 2 diabetes mellitus (HCC)   Relevant Medications   MOUNJARO  15 MG/0.5ML Pen   metFORMIN  (GLUCOPHAGE ) 1000 MG tablet   losartan  (COZAAR ) 50 MG tablet   Other Relevant Orders   CT CARDIAC SCORING (SELF PAY ONLY)   Morbid obesity (HCC)   Relevant Medications   MOUNJARO  15 MG/0.5ML Pen   metFORMIN  (GLUCOPHAGE ) 1000 MG tablet   Other Relevant Orders   CT CARDIAC SCORING (SELF PAY ONLY)   Type 2 diabetes mellitus with other specified complication (HCC) - Primary   Relevant Medications   MOUNJARO  15 MG/0.5ML Pen   metFORMIN  (GLUCOPHAGE ) 1000 MG tablet   losartan  (COZAAR ) 50 MG tablet   Other Relevant Orders   CT CARDIAC SCORING (SELF PAY ONLY)   Other Visit Diagnoses       Long-term current use of injectable noninsulin antidiabetic medication         Screening for colon cancer       Relevant Orders   Cologuard     Dermoid cyst of scalp       Relevant Orders   Ambulatory referral to General Surgery       Type 2 diabetes mellitus with other specified complication with Hyperlipidemia Diabetes well-controlled with A1c reduced from 8.2% to 7.6%. No medication side effects. Challenges with activity and diet noted. - Continue Mounjaro  15 mg, losartan , and metformin . - Refilled prescriptions for Mounjaro , losartan , and metformin .  Essential hypertension - Continue losartan . - Refilled losartan  prescription.  Morbid Obesity BMI >36 With comorbid diabetes, hyperlipidemia Encourage lifestyle modifications continue diet exercise regimen ON GLP1  already  Hyperlipidemia secondary to Diabetes Cholesterol levels off target. - Continue current management and monitor cholesterol levels.  Anxiety disorder No new concerns or side effects. - Continue fluoxetine . - Refilled fluoxetine  prescription.  Dermoid cyst of scalp Tender 2 cm dermoid cyst on right scalp. Previous excisions successful. - Referred to [facility] for excision of scalp cyst.  General health maintenance Cologuard ordered. Shingles vaccine planned. - Ordered Cologuard for colorectal cancer screening. - Plan for shingles vaccine administration in the future.        Orders Placed This Encounter  Procedures   CT CARDIAC SCORING (SELF PAY ONLY)    Standing Status:   Future    Expiration Date:   05/21/2025    Preferred imaging location?:   [facility]   Cologuard   Ambulatory referral to General Surgery    Referral Priority:   Routine    Referral Type:   Surgical    Referral Reason:   Specialty Services Required    Requested Specialty:   General Surgery    Number of Visits Requested:   1    Meds ordered this encounter  Medications   MOUNJARO  15 MG/0.5ML Pen    Sig: Inject 15 mg into the skin once a week.    Dispense:  2 mL    Refill:  5    Change to 30 day, update refills   metFORMIN  (GLUCOPHAGE ) 1000 MG tablet    Sig: Take 1 tablet (1,000 mg total)  by mouth 2 (two) times daily with a meal.    Dispense:  180 tablet    Refill:  3    Add refills   losartan  (COZAAR ) 50 MG tablet    Sig: Take 1 tablet (50 mg total) by mouth daily.    Dispense:  90 tablet    Refill:  3    Add refills   FLUoxetine  (PROZAC ) 20 MG capsule    Sig: Take 1 capsule (20 mg total) by mouth daily.    Dispense:  90 capsule    Refill:  3    Add refills    Follow up plan: Return for 6 month fasting lab > 1 week later Annual Physical.  Future labs ordered for 6./22/26   Marsa Officer, DO [facility] 05/21/2024, 10:14 AM     [1]  Social History Tobacco Use   Smoking status: Never   Smokeless tobacco: Never  Vaping Use   Vaping status: Never Used  Substance Use Topics   Alcohol use: Yes    Alcohol/week: 0.0 standard drinks of alcohol    Comment: occasional   Drug use: No   "

## 2024-05-21 NOTE — Patient Instructions (Addendum)
 Thank you for coming to the office today.  Referral to [facility] for the scalp cyst excision  Refills sent  Colon Cancer Screening: Ordered the Cologuard (home kit) test for colon cancer screening. Stay tuned for further updates.  It will be shipped to you directly. If not received in 2-4 weeks, call us  or the company.   If you send it back and no results are received in 2-4 weeks, call us  or the company as well!   Colon Cancer Screening: - For all adults age 60+ routine colon cancer screening is highly recommended.     - Recent guidelines from American Cancer Society recommend starting age of 25 - Early detection of colon cancer is important, because often there are no warning signs or symptoms, also if found early usually it can be cured. Late stage is hard to treat.   - If Cologuard is NEGATIVE, then it is good for 3 years before next due - If Cologuard is POSITIVE, then it is strongly advised to get a Colonoscopy, which allows the GI doctor to locate the source of the cancer or polyp (even very early stage) and treat it by removing it. ------------------------- Follow instructions to collect sample, you may call the company for any help or questions, 24/7 telephone support at [phone].  DUE for FASTING BLOOD WORK (no food or drink after midnight before the lab appointment, only water or coffee without cream/sugar on the morning of)  SCHEDULE Lab Only visit in the morning at the clinic for lab draw in 6 MONTHS   - Make sure Lab Only appointment is at about 1 week before your next appointment, so that results will be available  For Lab Results, once available within 2-3 days of blood draw, you can can log in to MyChart online to view your results and a brief explanation. Also, we can discuss results at next follow-up visit.   Please schedule a Follow-up Appointment to: Return for 6 month fasting lab > 1 week later Annual Physical.  If you have any other questions  or concerns, please feel free to call the office or send a message through MyChart. You may also schedule an earlier appointment if necessary.  Additionally, you may be receiving a survey about your experience at our office within a few days to 1 week by e-mail or mail. We value your feedback.  Marsa Officer, DO [facility], [place]

## 2024-06-06 ENCOUNTER — Ambulatory Visit
Admission: RE | Admit: 2024-06-06 | Discharge: 2024-06-06 | Disposition: A | Discharge status: Home / Self Care | Payer: Self-pay | Source: Ambulatory Visit | Attending: Family Medicine | Admitting: Family Medicine

## 2024-06-06 DIAGNOSIS — E1169 Type 2 diabetes mellitus with other specified complication: Secondary | ICD-10-CM | POA: Insufficient documentation

## 2024-06-06 DIAGNOSIS — I10 Essential (primary) hypertension: Secondary | ICD-10-CM | POA: Insufficient documentation

## 2024-06-06 DIAGNOSIS — E785 Hyperlipidemia, unspecified: Secondary | ICD-10-CM | POA: Insufficient documentation

## 2024-06-07 ENCOUNTER — Ambulatory Visit: Payer: Self-pay | Admitting: Surgery

## 2024-06-07 ENCOUNTER — Ambulatory Visit: Payer: Self-pay | Admitting: Family Medicine

## 2024-06-07 ENCOUNTER — Encounter: Payer: Self-pay | Admitting: Surgery

## 2024-06-07 VITALS — BP 130/85 | HR 87 | Ht 75.0 in | Wt 293.0 lb

## 2024-06-07 DIAGNOSIS — L729 Follicular cyst of the skin and subcutaneous tissue, unspecified: Secondary | ICD-10-CM

## 2024-06-07 DIAGNOSIS — L723 Sebaceous cyst: Secondary | ICD-10-CM

## 2024-06-07 NOTE — Patient Instructions (Signed)
 We will schedule you for an in office procedure to remove this cyst. You do not need a driver that day but may have someone with you if you like.   Call us  if the are starts to get very painful or starts to drain.   Pocket of Fluid in the Skin (Epidermoid Cyst): What to Know  An epidermoid cyst is a small lump under your skin. The cyst contains a substance that is thick and oily. What are the causes? A blocked hair follicle. A hair curls and re-enters the skin instead of growing straight out of the skin. A blocked pore. Irritated skin. An injury to the skin. Certain conditions that are passed from parent to child. Human papillomavirus (HPV). This happens rarely when cysts occur on the bottom of the feet. Long-term sun damage to the skin. What increases the risk? Having acne. Being male. Having an injury to the skin. Being past puberty. Certain conditions that are passed down through family (genetic disorder). What are the signs or symptoms? The only sign of this type of cyst may be a small, painless lump under the skin. These cysts are usually painless, but they can get infected. Symptoms of infection may include: Redness. Inflammation. Tenderness. Warmth. Fever. A bad-smelling substance that drains from the cyst. Pus that drains from the cyst. How is this treated? If a cyst becomes inflamed, treatment may include: Opening and draining the cyst. Antibiotics. Shots of medicines called steroids that help lessen inflammation. Surgery to remove the cyst if it's large, painful, or could turn into cancer. Do not try to open or squeeze a cyst yourself. Follow these instructions at home: Medicines Take your medicines only as told. If you were given antibiotics, take them as told. Do not stop taking them even if you start to feel better. General instructions Keep the area around your cyst clean and dry. Wear loose, dry clothing. Avoid touching your cyst. Check your cyst every day  for signs of infection. Check for: Redness, swelling, or pain. Fluid or blood. Warmth. Pus or a bad smell. Keep all follow-up visits to make sure there's no discomfort or infection. Contact a doctor if: You have any signs of infection. Your cyst doesn't get better or gets worse. You get a cyst that looks different from other cysts you've had. You have a fever. You have redness that spreads from the cyst. This information is not intended to replace advice given to you by your health care provider. Make sure you discuss any questions you have with your health care provider. Document Revised: 12/30/2022 Document Reviewed: 12/30/2022 Elsevier Patient Education  2024 [employer].

## 2024-06-07 NOTE — Progress Notes (Signed)
 " 06/07/2024  Reason for Visit: Right scalp cyst  Requesting Provider: Marsa Officer, DO  History of Present Illness: Colton Long is a 49 y.o. male presenting for evaluation of a right scalp cyst.  The patient is known to our practice but has not been seen in more than 3 years.  On his last visit, he had an excision of 2 left scalp cysts.  The pathology results were benign.  He has been doing well but reports that recently he has had a cyst in the right scalp that has been slowly growing in size.  As it has been growing, he also reports that it has become more bothersome particularly while he is combing his hair or while he is getting haircuts.  He denies any drainage or episodes of redness or erythema or induration in the area.  However due to the discomfort, he is interested in resecting this as well just like the prior areas.  Denies any fevers, chills, chest pain, shortness of breath.  Past Medical History: Past Medical History:  Diagnosis Date   Hypertension    Sleep apnea      Past Surgical History: Past Surgical History:  Procedure Laterality Date   CYSTECTOMY  2006,2011   head    NASAL SEPTUM SURGERY  2005   skin lesion exicision  2012   scalp [facility]    Home Medications: Prior to Admission medications  Medication Sig Start Date End Date Taking? Authorizing Provider  Blood Glucose Monitoring Suppl (ONE TOUCH ULTRA SYSTEM KIT) w/Device KIT 1 kit by Does not apply route once.   Yes [provider]  FLUoxetine  (PROZAC ) 20 MG capsule Take 1 capsule (20 mg total) by mouth daily. 05/21/24  Yes Karamalegos, Marsa PARAS, DO  fluticasone  (FLONASE ) 50 MCG/ACT nasal spray Place 2 sprays into both nostrils daily. Use for 4-6 weeks then stop and use seasonally or as needed. Patient taking differently: Place 2 sprays into both nostrils as needed. Use for 4-6 weeks then stop and use seasonally or as needed. 12/28/16  Yes Karamalegos, [name] J, DO  glucose blood  test strip 1 each by Other route as needed for other. Use as instructed   Yes [provider]  Insulin  Pen Needle (INSUPEN PEN NEEDLES) 32G X 4 MM MISC Use with Ozempic  pen inject weekly 07/26/17  Yes Karamalegos, Marsa PARAS, DO  losartan  (COZAAR ) 50 MG tablet Take 1 tablet (50 mg total) by mouth daily. 05/21/24  Yes Karamalegos, Marsa PARAS, DO  metFORMIN  (GLUCOPHAGE ) 1000 MG tablet Take 1 tablet (1,000 mg total) by mouth 2 (two) times daily with a meal. 05/21/24  Yes Karamalegos, Marsa PARAS, DO  MOUNJARO  15 MG/0.5ML Pen Inject 15 mg into the skin once a week. 05/21/24  Yes Karamalegos, Marsa PARAS, DO  mupirocin  ointment (BACTROBAN ) 2 % Apply 1 Application topically 2 (two) times daily. For 1-2 weeks as needed for skin sores. 05/12/22  Yes Karamalegos, Marsa PARAS, DO  pravastatin  (PRAVACHOL ) 20 MG tablet Take 1 tablet (20 mg total) by mouth daily. 12/19/23  Yes Karamalegos, Marsa PARAS, DO  Cholecalciferol (VITAMIN D -3 PO) Take 2,000 Int'l Units by mouth daily. Patient not taking: Reported on 06/07/2024    [provider]    Allergies: Allergies[1]  Social History:  reports that he has never smoked. He has never been exposed to tobacco smoke. He has never used smokeless tobacco. He reports current alcohol use. He reports that he does not use drugs.   Family History: Family History  Problem Relation Age of Onset   Hypertension Mother    Diabetes Mother    Cancer Father        lung   Heart disease Father    Hypertension Father    Diabetes Father    Prostate cancer Paternal Uncle    Colon cancer Neg Hx     Review of Systems: Review of Systems  Constitutional:  Negative for chills and fever.  Respiratory:  Negative for shortness of breath.   Cardiovascular:  Negative for chest pain.  Gastrointestinal:  Negative for nausea and vomiting.  Skin:        Right scalp cyst    Physical Exam BP 130/85   Pulse 87   Ht 6' 3 (1.905 m)   Wt 293 lb (132.9 kg)   SpO2  97%   BMI 36.62 kg/m  CONSTITUTIONAL: No acute distress HEENT: Patient has a 1.5 cm mass in the right superior portion of the scalp.  It is somewhat mobile, a little bit firm, and nontender.  There is no erythema or induration. RESPIRATORY:  Normal respiratory effort without pathologic use of accessory muscles. CARDIOVASCULAR: Regular rhythm and rate. MUSCULOSKELETAL:  Normal muscle strength and tone in all four extremities.  No peripheral edema or cyanosis. NEUROLOGIC:  Motor and sensation is grossly normal.  Cranial nerves are grossly intact. PSYCH:  Alert and oriented to person, place and time. Affect is normal.  Laboratory Analysis: Labs from 05/17/2024: Sodium 139, potassium 4.8, chloride 103, CO2 26, BUN 13, creatinine 0.8.  LFTs within normal limits.  Hemoglobin A1c 7.6.  Imaging: No results found.  Assessment and Plan: This is a 49 y.o. male with a right scalp cyst.  - Discussed with patient that the mass that he has is likely a sebaceous cyst of the scalp.  This is similar to the cyst that he had on the left side that we excised in 2022.  The patient is interested in having excision of this cyst due to the increasing discomfort that he has been experiencing.  I think is very reasonable and we will schedule him for an office procedure for right scalp cyst excision.  Risks of bleeding, infection, injury to surrounding structures were explained to the patient again and he is willing to proceed. - Patient will be scheduled for procedure on 06/26/2024.  All of his questions have been answered.  I spent 30 minutes dedicated to the care of this patient on the date of this encounter to include pre-visit review of records, face-to-face time with the patient discussing diagnosis and management, and any post-visit coordination of care.   Colton Sheree Plant, MD [facility]       [1]  Allergies Allergen Reactions   Other Rash    Steroid Cream   "

## 2024-06-17 ENCOUNTER — Encounter: Payer: Self-pay | Admitting: Family Medicine

## 2024-06-17 DIAGNOSIS — K760 Fatty (change of) liver, not elsewhere classified: Secondary | ICD-10-CM | POA: Insufficient documentation

## 2024-06-26 ENCOUNTER — Encounter: Payer: Self-pay | Admitting: Surgery

## 2024-06-26 ENCOUNTER — Ambulatory Visit (INDEPENDENT_AMBULATORY_CARE_PROVIDER_SITE_OTHER): Admitting: Surgery

## 2024-06-26 VITALS — BP 114/79 | HR 101 | Temp 98.4°F | Ht 75.0 in | Wt 290.0 lb

## 2024-06-26 DIAGNOSIS — L729 Follicular cyst of the skin and subcutaneous tissue, unspecified: Secondary | ICD-10-CM | POA: Diagnosis not present

## 2024-06-26 NOTE — Patient Instructions (Signed)
 We have removed a Cyst in our office today.  You have sutures under the skin that will dissolve and also dermabond (skin glue) on top of your skin which will come off on it's own in 10-14 days.  You may use Ibuprofen or Tylenol as needed for pain control. Use the ice pack 3-4 times a day for the next two days for any achiness.  You may shower 06/27/2024. Do not scrub at the area.   Avoid Strenuous activities that will make you sweat during the next 48 hours to avoid the glue coming off prematurely. Avoid activities that will place pressure to this area of the body for 1-2 weeks to avoid re-injury to incision site.  Please see your follow-up appointment provided. We will see you back in office to make sure this area is healed and to review the final pathology. If you have any questions or concerns prior to this appointment, call our office and speak with a nurse.    Excision of Skin Cysts or Lesions Excision of a skin lesion refers to the removal of a section of skin by making small cuts (incisions) in the skin. This procedure may be done to remove a cancerous (malignant) or noncancerous (benign) growth on the skin. It is typically done to treat or prevent cancer or infection. It may also be done to improve cosmetic appearance. The procedure may be done to remove: Cancerous growths, such as basal cell carcinoma, squamous cell carcinoma, or melanoma. Noncancerous growths, such as a cyst or lipoma. Growths, such as moles or skin tags, which may be removed for cosmetic reasons.  Various excision or surgical techniques may be used depending on your condition, the location of the lesion, and your overall health. Tell a health care provider about: Any allergies you have. All medicines you are taking, including vitamins, herbs, eye drops, creams, and over-the-counter medicines. Any problems you or family members have had with anesthetic medicines. Any blood disorders you have. Any surgeries you  have had. Any medical conditions you have. Whether you are pregnant or may be pregnant. What are the risks? Generally, this is a safe procedure. However, problems may occur, including: Bleeding. Infection. Scarring. Recurrence of the cyst, lipoma, or cancer. Changes in skin sensation or appearance, such as discoloration or swelling. Reaction to the anesthetics. Allergic reaction to surgical materials or ointments. Damage to nerves, blood vessels, muscles, or other structures. Continued pain.  What happens before the procedure? Ask your health care provider about: Changing or stopping your regular medicines. This is especially important if you are taking diabetes medicines or blood thinners. Taking medicines such as aspirin and ibuprofen. These medicines can thin your blood. Do not take these medicines before your procedure if your health care provider instructs you not to. You may be asked to take certain medicines. You may be asked to stop smoking. You may have an exam or testing. What happens during the procedure? To reduce your risk of infection: Your health care team will wash or sanitize their hands. Your skin will be washed with soap. You will be given a medicine to numb the area (local anesthetic). One of the following excision techniques will be performed. At the end of any of these procedures, antibiotic ointment will be applied as needed. Each of the following techniques may vary among health care providers and hospitals. Complete Surgical Excision The area of skin that needs to be removed will be marked with a pen. Using a small scalpel or scissors, the  surgeon will gently cut around and under the lesion until it is completely removed. The lesion will be placed in a fluid and sent to the lab for examination. If necessary, bleeding will be controlled with a device that delivers heat (electrocautery). The edges of the wound may be stitched (sutured) together, and a bandage  (dressing) or surgical glue will be applied. This procedure may be performed to treat a cancerous growth or a noncancerous cyst or lesion.  What happens after the procedure? Return to your normal activities as told by your health care provider. Report any excessive bleeding, spreading redness, or increased pain.

## 2024-06-26 NOTE — Progress Notes (Signed)
" °  Procedure Date:  06/26/2024  Pre-operative Diagnosis:  Right scalp cyst  Post-operative Diagnosis:  Right scalp cyst, 1 cm.  Procedure:  Excision of right scalp cyst, layered closure of 1.5 cm incision  Surgeon:  Aloysius Sheree Plant, MD  Anesthesia:  2 ml 1% lidocaine  with epi  Estimated Blood Loss:  5 ml  Specimens:  right scalp cyst  Complications:  None  Indications for Procedure:  This is a 49 y.o. male with diagnosis of a symptomatic right scalp cyst.  The patient wishes to have this excised. The risks of bleeding, abscess or infection, injury to surrounding structures, and need for further procedures were all discussed with the patient and he was willing to proceed.  Description of Procedure: The patient was correctly identified at bedside.  The patient was placed supine.  Appropriate time-outs were performed.  The patient's right scalp around the cyst was prepped and draped in usual sterile fashion.  Local anesthetic was infused intradermally.  A 1.5 cm elliptical incision was made over the cyst, and scalpel was used to dissect down the skin and subcutaneous tissue.  Skin flaps were created sharply, and then the cyst was excised intact.  It measured about 1 cm.  It was sent off to pathology.  The cavity was then irrigated and hemostasis was assured with manual pressure.  The wound was then closed in two layers using 3-0 Vicryl and 4-0 Monocryl.  The incision was cleaned and sealed with DermaBond.  The patient tolerated the procedure well and all sharps were appropriately disposed of at the end of the case.  --Patient may shower tomorrow. --Discussed wound precautions. --Follow up in 2 weeks.  Aloysius Sheree Plant, MD    "

## 2024-06-28 LAB — SURGICAL PATHOLOGY

## 2024-07-05 LAB — COLOGUARD: COLOGUARD: POSITIVE — AB

## 2024-07-10 ENCOUNTER — Ambulatory Visit: Admitting: Surgery

## 2024-11-12 ENCOUNTER — Other Ambulatory Visit

## 2024-11-18 ENCOUNTER — Other Ambulatory Visit

## 2024-11-25 ENCOUNTER — Encounter: Admitting: Family Medicine
